# Patient Record
Sex: Female | Born: 1990 | Race: White | Hispanic: No | Marital: Single | Smoking: Current some day smoker
Health system: Southern US, Community
[De-identification: ages and names within clinical notes are randomized; demographics above are authoritative.]

## PROBLEM LIST (undated history)

## (undated) DIAGNOSIS — N390 Urinary tract infection, site not specified: Secondary | ICD-10-CM

## (undated) DIAGNOSIS — K219 Gastro-esophageal reflux disease without esophagitis: Secondary | ICD-10-CM

## (undated) DIAGNOSIS — Z6827 Body mass index (BMI) 27.0-27.9, adult: Secondary | ICD-10-CM

## (undated) DIAGNOSIS — F410 Panic disorder [episodic paroxysmal anxiety] without agoraphobia: Secondary | ICD-10-CM

## (undated) DIAGNOSIS — N920 Excessive and frequent menstruation with regular cycle: Secondary | ICD-10-CM

## (undated) DIAGNOSIS — K259 Gastric ulcer, unspecified as acute or chronic, without hemorrhage or perforation: Secondary | ICD-10-CM

## (undated) DIAGNOSIS — K589 Irritable bowel syndrome without diarrhea: Secondary | ICD-10-CM

## (undated) DIAGNOSIS — F319 Bipolar disorder, unspecified: Secondary | ICD-10-CM

## (undated) DIAGNOSIS — G43909 Migraine, unspecified, not intractable, without status migrainosus: Secondary | ICD-10-CM

## (undated) DIAGNOSIS — F909 Attention-deficit hyperactivity disorder, unspecified type: Secondary | ICD-10-CM

## (undated) DIAGNOSIS — K2970 Gastritis, unspecified, without bleeding: Secondary | ICD-10-CM

## (undated) DIAGNOSIS — F419 Anxiety disorder, unspecified: Secondary | ICD-10-CM

## (undated) DIAGNOSIS — N3289 Other specified disorders of bladder: Secondary | ICD-10-CM

## (undated) HISTORY — DX: Other specified disorders of bladder: N32.89

## (undated) HISTORY — DX: Excessive and frequent menstruation with regular cycle: N92.0

## (undated) HISTORY — DX: Gastric ulcer, unspecified as acute or chronic, without hemorrhage or perforation: K25.9

## (undated) HISTORY — DX: Gastritis, unspecified, without bleeding: K29.70

## (undated) HISTORY — DX: Gastro-esophageal reflux disease without esophagitis: K21.9

## (undated) HISTORY — DX: Urinary tract infection, site not specified: N39.0

## (undated) HISTORY — DX: Migraine, unspecified, not intractable, without status migrainosus: G43.909

## (undated) HISTORY — DX: Irritable bowel syndrome, unspecified: K58.9

## (undated) HISTORY — DX: Body mass index (BMI) 27.0-27.9, adult: Z68.27

## (undated) HISTORY — DX: Panic disorder (episodic paroxysmal anxiety): F41.0

## (undated) HISTORY — PX: WISDOM TOOTH EXTRACTION: SHX21

---
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
## 2014-08-16 DIAGNOSIS — O1490 Unspecified pre-eclampsia, unspecified trimester: Secondary | ICD-10-CM

## 2014-08-16 HISTORY — DX: Unspecified pre-eclampsia, unspecified trimester: O14.90

## 2020-06-03 ENCOUNTER — Emergency Department (HOSPITAL_COMMUNITY)
Admission: EM | Admit: 2020-06-03 | Discharge: 2020-06-03 | Disposition: A | Discharge status: Home / Self Care | Payer: Medicaid Other | Attending: Emergency Medicine | Admitting: Emergency Medicine

## 2020-06-03 ENCOUNTER — Encounter (HOSPITAL_COMMUNITY): Payer: Self-pay | Admitting: *Deleted

## 2020-06-03 ENCOUNTER — Other Ambulatory Visit: Payer: Self-pay

## 2020-06-03 DIAGNOSIS — R002 Palpitations: Secondary | ICD-10-CM | POA: Diagnosis not present

## 2020-06-03 DIAGNOSIS — F1210 Cannabis abuse, uncomplicated: Secondary | ICD-10-CM | POA: Insufficient documentation

## 2020-06-03 DIAGNOSIS — F419 Anxiety disorder, unspecified: Secondary | ICD-10-CM | POA: Diagnosis present

## 2020-06-03 DIAGNOSIS — G4700 Insomnia, unspecified: Secondary | ICD-10-CM | POA: Diagnosis not present

## 2020-06-03 DIAGNOSIS — Z20822 Contact with and (suspected) exposure to covid-19: Secondary | ICD-10-CM | POA: Diagnosis not present

## 2020-06-03 DIAGNOSIS — R0789 Other chest pain: Secondary | ICD-10-CM | POA: Diagnosis not present

## 2020-06-03 DIAGNOSIS — F319 Bipolar disorder, unspecified: Secondary | ICD-10-CM | POA: Diagnosis not present

## 2020-06-03 DIAGNOSIS — F1310 Sedative, hypnotic or anxiolytic abuse, uncomplicated: Secondary | ICD-10-CM | POA: Diagnosis not present

## 2020-06-03 HISTORY — DX: Anxiety disorder, unspecified: F41.9

## 2020-06-03 HISTORY — DX: Attention-deficit hyperactivity disorder, unspecified type: F90.9

## 2020-06-03 HISTORY — DX: Bipolar disorder, unspecified: F31.9

## 2020-06-03 LAB — CBC
HCT: 44.7 % (ref 36.0–46.0)
Hemoglobin: 15.3 g/dL — ABNORMAL HIGH (ref 12.0–15.0)
MCH: 30.2 pg (ref 26.0–34.0)
MCHC: 34.2 g/dL (ref 30.0–36.0)
MCV: 88.3 fL (ref 80.0–100.0)
Platelets: 273 10*3/uL (ref 150–400)
RBC: 5.06 MIL/uL (ref 3.87–5.11)
RDW: 11.8 % (ref 11.5–15.5)
WBC: 11.5 10*3/uL — ABNORMAL HIGH (ref 4.0–10.5)
nRBC: 0 % (ref 0.0–0.2)

## 2020-06-03 LAB — COMPREHENSIVE METABOLIC PANEL
ALT: 20 U/L (ref 0–44)
AST: 17 U/L (ref 15–41)
Albumin: 4.2 g/dL (ref 3.5–5.0)
Alkaline Phosphatase: 77 U/L (ref 38–126)
Anion gap: 9 (ref 5–15)
BUN: 12 mg/dL (ref 6–20)
CO2: 21 mmol/L — ABNORMAL LOW (ref 22–32)
Calcium: 9.1 mg/dL (ref 8.9–10.3)
Chloride: 107 mmol/L (ref 98–111)
Creatinine, Ser: 0.78 mg/dL (ref 0.44–1.00)
GFR calc Af Amer: >60 mL/min (ref >60)
GFR calc non Af Amer: >60 mL/min (ref >60)
Glucose, Bld: 102 mg/dL — ABNORMAL HIGH (ref 70–99)
Potassium: 4 mmol/L (ref 3.5–5.1)
Sodium: 137 mmol/L (ref 135–145)
Total Bilirubin: 0.8 mg/dL (ref 0.3–1.2)
Total Protein: 7.2 g/dL (ref 6.5–8.1)

## 2020-06-03 LAB — SALICYLATE LEVEL: Salicylate Lvl: <7 mg/dL — ABNORMAL LOW (ref 7.0–30.0)

## 2020-06-03 LAB — I-STAT BETA HCG BLOOD, ED (MC, WL, AP ONLY): I-stat hCG, quantitative: <5 m[IU]/mL (ref <5)

## 2020-06-03 LAB — RAPID URINE DRUG SCREEN, HOSP PERFORMED
Amphetamines: NOT DETECTED
Barbiturates: NOT DETECTED
Benzodiazepines: POSITIVE — AB
Cocaine: NOT DETECTED
Opiates: NOT DETECTED
Tetrahydrocannabinol: POSITIVE — AB

## 2020-06-03 LAB — ACETAMINOPHEN LEVEL: Acetaminophen (Tylenol), Serum: <10 ug/mL — ABNORMAL LOW (ref 10–30)

## 2020-06-03 LAB — SARS CORONAVIRUS 2 BY RT PCR (HOSPITAL ORDER, PERFORMED IN ~~LOC~~ HOSPITAL LAB): SARS Coronavirus 2: NEGATIVE

## 2020-06-03 LAB — ETHANOL: Alcohol, Ethyl (B): <10 mg/dL (ref <10)

## 2020-06-03 NOTE — ED Triage Notes (Signed)
Pt reports being 8 months postpartum. Having severe anxiety since Monday. Has been seen by pcp and prescribed xanax with no relief. Anxiety is keeping her awake and nauseated. Denies any SI or HI.

## 2020-06-03 NOTE — ED Provider Notes (Addendum)
[facility] EMERGENCY DEPARTMENT Provider Note   CSN: [id number] Arrival date & time: 06/03/20  1812     History Chief Complaint  Patient presents with  . Anxiety    Laurie Guzman is a 29 y.o. female.  29 year old female with history of bipolar disorder as well as anxiety presents with increased insomnia.  Patient turned about postpartum depression as she is 8 months since she had her baby.  Has been prescribed Xanax which she says helps temporarily.  Denies any SI or HI.  States that times when anxiety and panic occurs she has palpitations and chest discomfort.  No anginal type qualities.        Past Medical History:  Diagnosis Date  . ADHD   . Anxiety   . Bipolar 1 disorder ([facility])     There are no problems to display for this patient.   History reviewed. No pertinent surgical history.   OB History   No obstetric history on file.     History reviewed. No pertinent family history.  Social History   Tobacco Use  . Smoking status: Never Smoker  Substance Use Topics  . Alcohol use: Yes  . Drug use: Never    Home Medications Prior to Admission medications   Not on File    Allergies    Patient has no known allergies.  Review of Systems   Review of Systems  All other systems reviewed and are negative.   Physical Exam Updated Vital Signs BP 137/85 (BP Location: Left Arm)   Pulse 76   Temp 98.1 F (36.7 C) (Oral)   Resp 20   LMP 05/10/2020   SpO2 100%   Physical Exam Vitals and nursing note reviewed.  Constitutional:      General: She is not in acute distress.    Appearance: Normal appearance. She is well-developed. She is not toxic-appearing.  HENT:     Head: Normocephalic and atraumatic.  Eyes:     General: Lids are normal.     Conjunctiva/sclera: Conjunctivae normal.     Pupils: Pupils are equal, round, and reactive to light.  Neck:     Thyroid: No thyroid mass.     Trachea: No tracheal deviation.    Cardiovascular:     Rate and Rhythm: Normal rate and regular rhythm.     Heart sounds: Normal heart sounds. No murmur heard.  No gallop.   Pulmonary:     Effort: Pulmonary effort is normal. No respiratory distress.     Breath sounds: Normal breath sounds. No stridor. No decreased breath sounds, wheezing, rhonchi or rales.  Abdominal:     General: Bowel sounds are normal. There is no distension.     Palpations: Abdomen is soft.     Tenderness: There is no abdominal tenderness. There is no rebound.  Musculoskeletal:        General: No tenderness. Normal range of motion.     Cervical back: Normal range of motion and neck supple.  Skin:    General: Skin is warm and dry.     Findings: No abrasion or rash.  Neurological:     Mental Status: She is alert and oriented to person, place, and time.     GCS: GCS eye subscore is 4. GCS verbal subscore is 5. GCS motor subscore is 6.     Cranial Nerves: No cranial nerve deficit.     Sensory: No sensory deficit.  Psychiatric:        Attention and Perception:  Attention normal.        Mood and Affect: Mood is anxious.        Speech: Speech is rapid and pressured.        Behavior: Behavior normal.        Thought Content: Thought content does not include homicidal or suicidal ideation. Thought content does not include homicidal or suicidal plan.     ED Results / Procedures / Treatments   Labs (all labs ordered are listed, but only abnormal results are displayed) Labs Reviewed  COMPREHENSIVE METABOLIC PANEL - Abnormal; Notable for the following components:      Result Value   CO2 21 (*)    Glucose, Bld 102 (*)    All other components within normal limits  SALICYLATE LEVEL - Abnormal; Notable for the following components:   Salicylate Lvl <7.0 (*)    All other components within normal limits  ACETAMINOPHEN LEVEL - Abnormal; Notable for the following components:   Acetaminophen (Tylenol), Serum <10 (*)    All other components within normal  limits  CBC - Abnormal; Notable for the following components:   WBC 11.5 (*)    Hemoglobin 15.3 (*)    All other components within normal limits  RAPID URINE DRUG SCREEN, HOSP PERFORMED - Abnormal; Notable for the following components:   Benzodiazepines POSITIVE (*)    Tetrahydrocannabinol POSITIVE (*)    All other components within normal limits  ETHANOL  I-STAT BETA HCG BLOOD, ED ([facility], [facility], [facility] ONLY)    EKG None  Radiology No results found.  Procedures Procedures (including critical care time)  Medications Ordered in ED Medications - No data to display  ED Course  I have reviewed the triage vital signs and the nursing notes.  Pertinent labs & imaging results that were available during my care of the patient were reviewed by me and considered in my medical decision making (see chart for details).    MDM Rules/Calculators/A&P                          Patient's labs reviewed and she is medically cleared.  Discussed with behavioral health NP and they have accepted her for her psychiatric evaluation at the facility.  Nurse notified and she will call safe transport.  Patient is agreeable to this.  11:03 PM Patient is now changed her mind who like to go home and says that her doctor schedule her to see a therapist tomorrow Final Clinical Impression(s) / ED Diagnoses Final diagnoses:  None    Rx / DC Orders ED Discharge Orders    None       Lorre Nick, MD 06/03/20 2207    Lorre Nick, MD 06/03/20 2303

## 2020-06-03 NOTE — ED Notes (Signed)
Pt refused transfer d/t refusal for personal belongings removal. Pt states she did not feel comfortable being without her cell phone due to her small child at home. Pt voluntary. Pt's wishes discussed with Dr. Freida Busman.   Pt discharged mother at bedside. Discharge instructions discussed with pt and mother. Pt was able to verbalized understanding with no questions at this time. Pt to follow up with therapist tomorrow. Denies si/hi

## 2020-11-02 ENCOUNTER — Telehealth: Payer: Self-pay | Admitting: Neurology

## 2020-11-02 ENCOUNTER — Ambulatory Visit: Payer: Medicaid Other | Admitting: Neurology

## 2020-11-02 ENCOUNTER — Encounter: Payer: Self-pay | Admitting: Neurology

## 2020-11-02 VITALS — BP 112/72 | HR 84 | Ht 65.5 in | Wt 172.0 lb

## 2020-11-02 DIAGNOSIS — H9190 Unspecified hearing loss, unspecified ear: Secondary | ICD-10-CM

## 2020-11-02 DIAGNOSIS — G08 Intracranial and intraspinal phlebitis and thrombophlebitis: Secondary | ICD-10-CM

## 2020-11-02 DIAGNOSIS — F909 Attention-deficit hyperactivity disorder, unspecified type: Secondary | ICD-10-CM

## 2020-11-02 DIAGNOSIS — F419 Anxiety disorder, unspecified: Secondary | ICD-10-CM | POA: Insufficient documentation

## 2020-11-02 DIAGNOSIS — R51 Headache with orthostatic component, not elsewhere classified: Secondary | ICD-10-CM

## 2020-11-02 DIAGNOSIS — K589 Irritable bowel syndrome without diarrhea: Secondary | ICD-10-CM | POA: Diagnosis not present

## 2020-11-02 DIAGNOSIS — F319 Bipolar disorder, unspecified: Secondary | ICD-10-CM | POA: Insufficient documentation

## 2020-11-02 DIAGNOSIS — G441 Vascular headache, not elsewhere classified: Secondary | ICD-10-CM

## 2020-11-02 DIAGNOSIS — H4710 Unspecified papilledema: Secondary | ICD-10-CM

## 2020-11-02 DIAGNOSIS — O26892 Other specified pregnancy related conditions, second trimester: Secondary | ICD-10-CM | POA: Diagnosis not present

## 2020-11-02 DIAGNOSIS — H5713 Ocular pain, bilateral: Secondary | ICD-10-CM

## 2020-11-02 DIAGNOSIS — H579 Unspecified disorder of eye and adnexa: Secondary | ICD-10-CM

## 2020-11-02 DIAGNOSIS — Z3A13 13 weeks gestation of pregnancy: Secondary | ICD-10-CM

## 2020-11-02 DIAGNOSIS — H538 Other visual disturbances: Secondary | ICD-10-CM

## 2020-11-02 DIAGNOSIS — R519 Headache, unspecified: Secondary | ICD-10-CM | POA: Insufficient documentation

## 2020-11-02 DIAGNOSIS — H5460 Unqualified visual loss, one eye, unspecified: Secondary | ICD-10-CM

## 2020-11-02 NOTE — Telephone Encounter (Signed)
medicaid order sent to [facility]. No auth they will reach out to the patient to schedule.  

## 2020-11-02 NOTE — Addendum Note (Signed)
Addended by: Naomie Dean B on: 11/02/2020 11:39 AM   Modules accepted: Orders

## 2020-11-02 NOTE — Patient Instructions (Signed)
MRI of the brain and eyes. Will also take a look at the blood vessels  To prevent or relieve headaches, try the following: Cool Compress. Lie down and place a cool compress on your head.  Avoid headache triggers. If certain foods or odors seem to have triggered your migraines in the past, avoid them. A headache diary might help you identify triggers.  Include physical activity in your daily routine. Try a daily walk or other moderate aerobic exercise.  Manage stress. Find healthy ways to cope with the stressors, such as delegating tasks on your to-do list.  Practice relaxation techniques. Try deep breathing, yoga, massage and visualization.  Eat regularly. Eating regularly scheduled meals and maintaining a healthy diet might help prevent headaches. Also, drink plenty of fluids.  Follow a regular sleep schedule. Sleep deprivation might contribute to headaches Consider biofeedback. With this mind-body technique, you learn to control certain bodily functions -- such as muscle tension, heart rate and blood pressure -- to prevent headaches or reduce headache pain.    Proceed to emergency room if you experience new or worsening symptoms or symptoms do not resolve, if you have new neurologic symptoms or if headache is severe, or for any concerning symptom.

## 2020-11-02 NOTE — Progress Notes (Addendum)
[facility]    Provider:  Dr Lucia Gaskins Requesting Provider: Buckner Malta, MD Primary Care Provider:  Lise Auer, MD  CC:  Ear pressure, nose pressure, headache, ear popping, dizzy, panic attacks  HPI:  Laurie Guzman is a 29 y.o. female here as requested by Buckner Malta, MD for Migraine.  Past medical history severe anxiety with panic attacks, bipolar disorder.  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  She started getting panic attacks in June/july. With the panic attacks she started having headaches. But now the headaches occur more often, significant ear pressure, ear popping, sharp pains through her ears, pressure right at the sinus under the eyes, she went to ENT and they said everything was normal. Her grandmother says she calls and she is crying, she never had a history of migraines, she feels a lot of pressure in the head, significant dizziness, positional headache, episodes of blurry vision and peripheral vision loss, worse laying down she can;t even lay down it hurts so much and she gets dizzy she feels like she is moving or the room spins. Sounds bother her, light doesn't bother her, she gets nauseated. Most of the time the headaches are around the temples with  throbbing. Grandmother has migraines and her aunt does too. She is [redacted] weeks pregnant. Eye pain as well on movement. She has monocular vision loss episodically in the right eye. No weakness. Blood pressure has been fine. No fever. No other focal neurologic deficits, associated symptoms, inciting events or modifiable factors.  Reviewed notes, labs and imaging from outside physicians, which showed:  See above  Labs: 10/14/2020 positive pregnancy test urine, hgb 13.5 01/02/2019,   Review of Systems: Patient complains of symptoms per HPI as well as the following symptoms: headache, weakness, eye pain, constipation, ringing in ears, spinning sensation, anxiety, decreased energy, racing thoughts. Pertinent negatives and positives per HPI. All others negative.   Social History   Socioeconomic History  . Marital status: Single    Spouse name: Not on file  . Number of children: 2  . Years of education: Not on file  . Highest education level: Some college, no degree  Occupational History  . Not on file  Tobacco Use  . Smoking status: Never Smoker  . Smokeless tobacco: Never Used  Substance and Sexual Activity  . Alcohol use: Not Currently    Comment: quit July 19, 2020  . Drug use: Never  . Sexual activity: Not on file  Other Topics Concern  . Not on file  Social History Narrative   Lives at home with 2 kids and boyfriend    Left handed   Caffeine: "not really"   Social Determinants of Health   Financial Resource Strain:   . Difficulty of Paying Living Expenses: Not on file  Food Insecurity:   .  Worried About Programme researcher, broadcasting/film/video in the Last Year: Not on file  . Ran Out of Food in the Last Year: Not on file  Transportation Needs:   . Lack of Transportation (Medical): Not on file  . Lack of Transportation (Non-Medical): Not on file  Physical Activity:   . Days of Exercise per Week: Not on file  . Minutes of Exercise per Session: Not on file  Stress:   . Feeling of Stress : Not on  file  Social Connections:   . Frequency of Communication with Friends and Family: Not on file  . Frequency of Social Gatherings with Friends and Family: Not on file  . Attends Religious Services: Not on file  . Active Member of Clubs or Organizations: Not on file  . Attends Banker Meetings: Not on file  . Marital Status: Not on file  Intimate Partner Violence:   . Fear of Current or Ex-Partner: Not on file  . Emotionally Abused: Not on file  . Physically Abused: Not on file  . Sexually Abused: Not on file    Family History  Problem Relation Age of Onset  . Migraines Paternal Grandmother   . Migraines Paternal Aunt   . Migraines Paternal Aunt     Past Medical History:  Diagnosis Date  . ADHD   . Anxiety   . Bipolar 1 disorder ([facility])   . Bladder spasms   . Chlamydia infection 06/2008  . IBS (irritable bowel syndrome)   . UTI (urinary tract infection)    used to get bad UTIs    Patient Active Problem List   Diagnosis Date Noted  . Pregnancy headache in second trimester 11/02/2020  . IBS (irritable bowel syndrome)   . Bipolar 1 disorder ([facility])   . Anxiety   . ADHD     Past Surgical History:  Procedure Laterality Date  . WISDOM TOOTH EXTRACTION      Current Outpatient Medications  Medication Sig Dispense Refill  . diphenhydrAMINE (BENADRYL) 50 MG capsule Take 50 mg by mouth in the morning, at noon, and at bedtime.    . ondansetron (ZOFRAN-ODT) 4 MG disintegrating tablet Take 4 mg by mouth as needed for nausea or vomiting.     No current facility-administered medications for this visit.    Allergies as of 11/02/2020  . (No Known Allergies)    Vitals: BP 112/72 (BP Location: Right Arm, Patient Position: Sitting)   Pulse 84   Ht 5' 5.5" (1.664 m)   Wt 172 lb (78 kg)   LMP 05/10/2020   BMI 28.19 kg/m  Last Weight:  Wt Readings from Last 1 Encounters:  11/02/20 172 lb (78 kg)   Last Height:   Ht Readings from Last 1 Encounters:  11/02/20 5'  5.5" (1.664 m)     Physical exam: Exam: Gen: NAD, conversant, well nourised,  well groomed                     CV: RRR, no MRG. No Carotid Bruits. No peripheral edema, warm, nontender Eyes: Conjunctivae clear without exudates or hemorrhage  Neuro: Detailed Neurologic Exam  Speech:    Speech is normal; fluent and spontaneous with normal comprehension.  Cognition:    The patient is oriented to person, place, and time;     recent and remote memory intact;     language fluent;     normal attention, concentration,     fund of knowledge Cranial Nerves:  The pupils are equal, round, and reactive to light. The fundi are slightly elevated Visual fields are full to finger confrontation. Extraocular movements are intact. Trigeminal sensation is intact and the muscles of mastication are normal. The face is symmetric. The palate elevates in the midline. Hearing intact. Voice is normal. Shoulder shrug is normal. The tongue has normal motion without fasciculations.   Coordination:    No dysmetria or ataxia  Gait:    Normal native gait  Motor Observation:    No asymmetry, no atrophy, and no involuntary movements noted. Tone:    Normal muscle tone.    Posture:    Posture is normal. normal erect    Strength:    Strength is V/V in the upper and lower limbs.      Sensation: intact to LT     Reflex Exam:  DTR's:    Deep tendon reflexes in the upper and lower extremities are normal bilaterally.   Toes:    The toes are downgoing bilaterally.   Clonus:    Clonus is absent.    Assessment/Plan: This is a patient with an unusual constellation of symptoms, she is also [redacted] weeks pregnant, she reports a positional headache, much worse when she bends over or lays down, lots of pressure in her head, hearing changes and popping in her ears, visual changes bilaterally and episodic vision loss of the right eye, eye pain, funduscopy may show elevated optic disks, need MRI of the brain and orbits.   Also considering the symptoms in her pregnancy needed MRV to rule out cerebral venous thrombosis.  MRI of the brain and orbits: MRI brain due to concerning symptoms of morning headaches, positional headaches,vision changes, papilledema, hearing changes, eye pressure and pain, hypercoag state(pregnancy)  to look for space occupying mass, chiari or intracranial hypertension (pseudotumor) or other. Also need MRV for cerebral venous thrombosis.   Orders Placed This Encounter  Procedures  . MR ORBITS WO CONTRAST  . MR MRV HEAD WO CM  . MR BRAIN WO CONTRAST  . CBC  . Comprehensive metabolic panel     Cc: Buckner Malta, MD,  Lise Auer, MD  Naomie Dean, MD  [facility] [address] [place], [place] [address]  Phone [phone] Fax [phone]

## 2020-11-03 ENCOUNTER — Telehealth: Payer: Self-pay | Admitting: *Deleted

## 2020-11-03 LAB — CBC
Hematocrit: 39.1 % (ref 34.0–46.6)
Hemoglobin: 13.9 g/dL (ref 11.1–15.9)
MCH: 31.6 pg (ref 26.6–33.0)
MCHC: 35.5 g/dL (ref 31.5–35.7)
MCV: 89 fL (ref 79–97)
Platelets: 241 10*3/uL (ref 150–450)
RBC: 4.4 x10E6/uL (ref 3.77–5.28)
RDW: 12.1 % (ref 11.7–15.4)
WBC: 9.3 10*3/uL (ref 3.4–10.8)

## 2020-11-03 LAB — COMPREHENSIVE METABOLIC PANEL
ALT: 10 IU/L (ref 0–32)
AST: 10 IU/L (ref 0–40)
Albumin/Globulin Ratio: 1.6 (ref 1.2–2.2)
Albumin: 4.2 g/dL (ref 3.9–5.0)
Alkaline Phosphatase: 72 IU/L (ref 44–121)
BUN/Creatinine Ratio: 17 (ref 9–23)
BUN: 10 mg/dL (ref 6–20)
Bilirubin Total: 0.3 mg/dL (ref 0.0–1.2)
CO2: 20 mmol/L (ref 20–29)
Calcium: 9.5 mg/dL (ref 8.7–10.2)
Chloride: 103 mmol/L (ref 96–106)
Creatinine, Ser: 0.59 mg/dL (ref 0.57–1.00)
GFR calc Af Amer: 144 mL/min/{1.73_m2} (ref >59)
GFR calc non Af Amer: 125 mL/min/{1.73_m2} (ref >59)
Globulin, Total: 2.6 g/dL (ref 1.5–4.5)
Glucose: 76 mg/dL (ref 65–99)
Potassium: 4.1 mmol/L (ref 3.5–5.2)
Sodium: 138 mmol/L (ref 134–144)
Total Protein: 6.8 g/dL (ref 6.0–8.5)

## 2020-11-03 NOTE — Telephone Encounter (Signed)
-----   Message from Anson Fret, MD sent at 11/03/2020  8:57 AM EST ----- Blood work normal thanks

## 2020-11-03 NOTE — Telephone Encounter (Signed)
Called pt and let her know blood work was normal. Pt verbalized understanding and appreciation for the call.

## 2020-11-12 ENCOUNTER — Other Ambulatory Visit: Payer: Self-pay

## 2020-11-12 ENCOUNTER — Emergency Department (HOSPITAL_COMMUNITY): Payer: Medicaid Other

## 2020-11-12 ENCOUNTER — Emergency Department (HOSPITAL_COMMUNITY)
Admission: EM | Admit: 2020-11-12 | Discharge: 2020-11-13 | Disposition: A | Discharge status: Home / Self Care | Payer: Medicaid Other | Attending: Emergency Medicine | Admitting: Emergency Medicine

## 2020-11-12 DIAGNOSIS — Z3A14 14 weeks gestation of pregnancy: Secondary | ICD-10-CM | POA: Insufficient documentation

## 2020-11-12 DIAGNOSIS — O99891 Other specified diseases and conditions complicating pregnancy: Secondary | ICD-10-CM | POA: Insufficient documentation

## 2020-11-12 DIAGNOSIS — H9391 Unspecified disorder of right ear: Secondary | ICD-10-CM | POA: Diagnosis not present

## 2020-11-12 DIAGNOSIS — Z5321 Procedure and treatment not carried out due to patient leaving prior to being seen by health care provider: Secondary | ICD-10-CM | POA: Insufficient documentation

## 2020-11-12 LAB — URINALYSIS, ROUTINE W REFLEX MICROSCOPIC
Bilirubin Urine: NEGATIVE
Glucose, UA: NEGATIVE mg/dL
Hgb urine dipstick: NEGATIVE
Ketones, ur: NEGATIVE mg/dL
Leukocytes,Ua: NEGATIVE
Nitrite: NEGATIVE
Protein, ur: NEGATIVE mg/dL
Specific Gravity, Urine: 1.017 (ref 1.005–1.030)
pH: 5 (ref 5.0–8.0)

## 2020-11-12 LAB — COMPREHENSIVE METABOLIC PANEL
ALT: 15 U/L (ref 0–44)
AST: 16 U/L (ref 15–41)
Albumin: 3.4 g/dL — ABNORMAL LOW (ref 3.5–5.0)
Alkaline Phosphatase: 63 U/L (ref 38–126)
Anion gap: 11 (ref 5–15)
BUN: 10 mg/dL (ref 6–20)
CO2: 20 mmol/L — ABNORMAL LOW (ref 22–32)
Calcium: 9.1 mg/dL (ref 8.9–10.3)
Chloride: 104 mmol/L (ref 98–111)
Creatinine, Ser: 0.59 mg/dL (ref 0.44–1.00)
GFR, Estimated: >60 mL/min (ref >60)
Glucose, Bld: 149 mg/dL — ABNORMAL HIGH (ref 70–99)
Potassium: 3.4 mmol/L — ABNORMAL LOW (ref 3.5–5.1)
Sodium: 135 mmol/L (ref 135–145)
Total Bilirubin: 0.6 mg/dL (ref 0.3–1.2)
Total Protein: 6.5 g/dL (ref 6.5–8.1)

## 2020-11-12 LAB — CBC WITH DIFFERENTIAL/PLATELET
Abs Immature Granulocytes: 0.06 10*3/uL (ref 0.00–0.07)
Basophils Absolute: 0 10*3/uL (ref 0.0–0.1)
Basophils Relative: 0 %
Eosinophils Absolute: 0.1 10*3/uL (ref 0.0–0.5)
Eosinophils Relative: 1 %
HCT: 39.6 % (ref 36.0–46.0)
Hemoglobin: 13.4 g/dL (ref 12.0–15.0)
Immature Granulocytes: 1 %
Lymphocytes Relative: 16 %
Lymphs Abs: 1.8 10*3/uL (ref 0.7–4.0)
MCH: 30.2 pg (ref 26.0–34.0)
MCHC: 33.8 g/dL (ref 30.0–36.0)
MCV: 89.2 fL (ref 80.0–100.0)
Monocytes Absolute: 0.4 10*3/uL (ref 0.1–1.0)
Monocytes Relative: 4 %
Neutro Abs: 9.1 10*3/uL — ABNORMAL HIGH (ref 1.7–7.7)
Neutrophils Relative %: 78 %
Platelets: 220 10*3/uL (ref 150–400)
RBC: 4.44 MIL/uL (ref 3.87–5.11)
RDW: 12.3 % (ref 11.5–15.5)
WBC: 11.6 10*3/uL — ABNORMAL HIGH (ref 4.0–10.5)
nRBC: 0 % (ref 0.0–0.2)

## 2020-11-12 MED ORDER — ACETAMINOPHEN 500 MG PO TABS
1000.0000 mg | ORAL_TABLET | Freq: Once | ORAL | Status: AC
  Administered 2020-11-12: 1000 mg via ORAL
  Filled 2020-11-12: qty 2

## 2020-11-12 NOTE — ED Notes (Signed)
Pt name called for vitals, no response

## 2020-11-12 NOTE — ED Triage Notes (Signed)
Pt here for eval of R ear pressure. Being seen for same by [facility] and due to have MRIs tomorrow for same. Pt reports that pressure is so bad that it caused her to briefly pass out this morning. Also sts it feels like her throat is swelling, however pt speaking in complete sentences without difficulty. No shob. Pt [redacted] weeks pregnant.

## 2020-11-13 ENCOUNTER — Other Ambulatory Visit: Payer: Medicaid Other

## 2020-11-13 NOTE — ED Notes (Signed)
Pt stated she's leaving AMA since she has an appt [facility]. Advised to return if symptoms worsen.

## 2020-11-15 ENCOUNTER — Ambulatory Visit (INDEPENDENT_AMBULATORY_CARE_PROVIDER_SITE_OTHER): Payer: Medicaid Other | Admitting: Allergy and Immunology

## 2020-11-15 ENCOUNTER — Encounter: Payer: Self-pay | Admitting: Allergy and Immunology

## 2020-11-15 ENCOUNTER — Telehealth: Payer: Self-pay | Admitting: Neurology

## 2020-11-15 ENCOUNTER — Other Ambulatory Visit: Payer: Self-pay

## 2020-11-15 VITALS — BP 110/48 | HR 84 | Resp 16 | Ht 66.0 in | Wt 172.6 lb

## 2020-11-15 DIAGNOSIS — J3089 Other allergic rhinitis: Secondary | ICD-10-CM

## 2020-11-15 DIAGNOSIS — F410 Panic disorder [episodic paroxysmal anxiety] without agoraphobia: Secondary | ICD-10-CM

## 2020-11-15 DIAGNOSIS — G43909 Migraine, unspecified, not intractable, without status migrainosus: Secondary | ICD-10-CM

## 2020-11-15 DIAGNOSIS — F411 Generalized anxiety disorder: Secondary | ICD-10-CM | POA: Diagnosis not present

## 2020-11-15 DIAGNOSIS — H6993 Unspecified Eustachian tube disorder, bilateral: Secondary | ICD-10-CM

## 2020-11-15 DIAGNOSIS — H6983 Other specified disorders of Eustachian tube, bilateral: Secondary | ICD-10-CM | POA: Diagnosis not present

## 2020-11-15 DIAGNOSIS — Z3A14 14 weeks gestation of pregnancy: Secondary | ICD-10-CM

## 2020-11-15 MED ORDER — LORATADINE 10 MG PO TABS
ORAL_TABLET | ORAL | 11 refills | Status: DC
Start: 2020-11-15 — End: 2021-08-11

## 2020-11-15 NOTE — Telephone Encounter (Signed)
It doesn't look like she is currently inpatient. I have already ordered the MRV she can proceed to that as we already planned but cancel MR orbits please. thanks

## 2020-11-15 NOTE — Patient Instructions (Addendum)
  1. Blood -area two aero allergen profile, TSH, FT4, TP, [place] pig IgE  2.  Treat and prevent inflammation:   A. OTC Rhinocort / budesonide - 1 spray each nostril 2 times per day   3. Avoid all forms of caffeine and chocolate   4.  Discuss with obstetrician about receiving Covid vaccine and flu vaccine.  5. Can try Loratadine 10 mg - 1 tablet 1 time per day  6.  Can continue Benadryl if needed  7.  Further evaluation and treatment?

## 2020-11-15 NOTE — Telephone Encounter (Signed)
Patient did have a MRI Brain in the ER on 11/12/20. Do you want her to proceed on anything other?

## 2020-11-15 NOTE — Progress Notes (Signed)
[name] - [place] - [place]   Dear Dr. Welton Flakes,  Thank you for referring Laurie Guzman to the [facility] [place] on 11/15/2020.   Below is a summation of this patient's evaluation and recommendations.  Thank you for your referral. I will keep you informed about this patient's response to treatment.   If you have any questions please do not hesitate to contact me.   Sincerely,  Jessica Priest, MD Allergy / Immunology [facility]   ______________________________________________________________________    NEW PATIENT NOTE  Referring Provider: Lise Auer, MD Primary Provider: Lise Auer, MD Date of office visit: 11/15/2020    Subjective:   Chief Complaint:  Laurie Guzman (DOB: 1991-11-11) is a 29 y.o. female who presents to the clinic on 11/15/2020 with a chief complaint of sinus pressure .     HPI: [name] presents to this clinic in evaluation of symptoms that developed initially on 16 August 2020.  On 16 August 2020 she developed ear pain and headache and dizziness and this has been a persistent issue since that point in time.  She has recurrent bouts of vertigo and a sensation that her right eye is pushing out and a spot in her right visual field and a headache that can sometimes involve her entire head feeling as though there is intense pressure.  It became so significant this week and that she ended up in the emergency room with this head pressure issue and dizziness for which an MRI was obtained which apparently was normal.  She has seen a neurologist regarding this issue in the past and no specific therapy was administered during that evaluation.  She has seen ENT regarding this issue and no specific therapy was administered during that evaluation.  Apparently her hearing checked out okay and there was no evidence of Mnire's disease.  She has very  little airway issue.  She has no nose blowing or nasal congestion or anosmia or decreased ability to taste.  She does have some postnasal drip.  She has no other significant airway issues.  She does not really have an allergic history in general.  She has tried some antihistamines to address this issue which have not helped.  She has been using Benadryl which does help this issue.  There is no obvious provoking factor giving rise to this issue however she has found "black mold" in her mobile home.  She thinks that she is a little bit worse when she is staying at her house and she is now staying at her grandmother's house.  She started Zoloft about 3 weeks prior to her scenario but developed some headaches and had to discontinue the Zoloft and was switched to Paxil but still continued to have headaches and she discontinued this agent about 1 week prior to the onset of her scenario.  She is pregnant.  There was an interval of time since her last menstrual period when she did not have any sexual intercourse until 20 August 2020 which is a date that is subsequent to the onset of all of her symptoms on 16 August 2020.  She has been having some issues with anxiety lately.  These appear to be accompanied by panic attacks.  She does consume caffeine occasionally and has chocolate on occasion as well.  Past Medical History:  Diagnosis Date  . ADHD   . Anxiety   . Bipolar 1  disorder (HCC)   . Bladder spasms   . Chlamydia infection 06/2008  . IBS (irritable bowel syndrome)   . UTI (urinary tract infection)    used to get bad UTIs    Past Surgical History:  Procedure Laterality Date  . WISDOM TOOTH EXTRACTION      Allergies as of 11/15/2020   No Known Allergies     Medication List    diphenhydrAMINE 50 MG capsule Commonly known as: BENADRYL Take 50 mg by mouth in the morning, at noon, and at bedtime.       Review of systems negative except as noted in HPI / PMHx or noted  below:  Review of Systems  Constitutional: Negative.   HENT: Negative.   Eyes: Negative.   Respiratory: Negative.   Cardiovascular: Negative.   Gastrointestinal: Negative.   Genitourinary: Negative.   Musculoskeletal: Negative.   Skin: Negative.   Neurological: Negative.   Endo/Heme/Allergies: Negative.   Psychiatric/Behavioral: Negative.     Family History  Problem Relation Age of Onset  . Irritable bowel syndrome Mother   . Irritable bowel syndrome Father   . Ulcers Father        Stomach Ulcers  . Migraines Paternal Grandmother   . Migraines Paternal Aunt   . Migraines Paternal Aunt     Social History   Socioeconomic History  . Marital status: Single    Spouse name: Not on file  . Number of children: 2  . Years of education: Not on file  . Highest education level: Some college, no degree  Occupational History  . Not on file  Tobacco Use  . Smoking status: Former Smoker    Types: Cigarettes  . Smokeless tobacco: Never Used  . Tobacco comment: Social smoker; last smoked in August 2021  Substance and Sexual Activity  . Alcohol use: Not Currently    Comment: quit July 19, 2020  . Drug use: Never  . Sexual activity: Not on file  Other Topics Concern  . Not on file  Social History Narrative   Lives at home with 2 kids and boyfriend    Left handed   Caffeine: "not really"    Environmental and Social history  Lives in a mobile home with a dry environment, carpet located inside the household, a cat and [place] pig located inside the household,, no plastic on the bed, no plastic on the pillow, no smoking ongoing with inside the household.  She works as a Lawyer.  Objective:   Vitals:   11/15/20 0944  BP: (!) 110/48  Pulse: 84  Resp: 16  SpO2: 98%   Height: 5\' 6"  (167.6 cm) Weight: 172 lb 9.6 oz (78.3 kg)  Physical Exam Constitutional:      Appearance: She is not diaphoretic.  HENT:     Head: Normocephalic.     Right Ear: Tympanic membrane, ear canal  and external ear normal.     Left Ear: Tympanic membrane, ear canal and external ear normal.     Nose: Nose normal. No mucosal edema or rhinorrhea.     Mouth/Throat:     Pharynx: Uvula midline. No oropharyngeal exudate.  Eyes:     Conjunctiva/sclera: Conjunctivae normal.  Neck:     Thyroid: No thyromegaly.     Trachea: Trachea normal. No tracheal tenderness or tracheal deviation.  Cardiovascular:     Rate and Rhythm: Normal rate and regular rhythm.     Heart sounds: Normal heart sounds, S1 normal and S2 normal. No murmur heard.  Pulmonary:     Effort: No respiratory distress.     Breath sounds: Normal breath sounds. No stridor. No wheezing or rales.  Lymphadenopathy:     Head:     Right side of head: No tonsillar adenopathy.     Left side of head: No tonsillar adenopathy.     Cervical: No cervical adenopathy.  Skin:    Findings: No erythema or rash.     Nails: There is no clubbing.  Neurological:     Mental Status: She is alert.     Diagnostics: Allergy skin tests were not performed.     Results of an brain MRI obtained 12 November 2020 identified the following:  Brain: Cerebral volume is normal. No focal parenchymal signal abnormality is identified. There is no acute infarct. No evidence of intracranial mass. No chronic intracranial blood products. No extra-axial fluid collection. No midline shift. Vascular: Expected proximal arterial flow voids. Skull and upper cervical spine: No focal marrow lesion.  Sinuses/Orbits: Visualized orbits show no acute finding. No significant paranasal sinus disease.  Results of blood tests obtained 12 November 2020 identified creatinine 0.59 mg/DL, AST 16 U/L, ALT 15 U/L, glucose 149 mg/DL, WBC 84.6, absolute eosinophil 100, absolute lymphocyte 1800, hemoglobin 13.4, platelet 220,  Assessment and Plan:    1. Dysfunction of both eustachian tubes   2. Migraine syndrome   3. Perennial allergic rhinitis   4. Generalized  anxiety disorder with panic attacks   5. [redacted] weeks gestation of pregnancy     1. Blood -area two aero allergen profile, TSH, FT4, TP, [place] pig IgE  2.  Treat and prevent inflammation:   A. OTC Rhinocort / budesonide - 1 spray each nostril 2 times per day   3. Avoid all forms of caffeine and chocolate   4.  Discuss with obstetrician about receiving Covid vaccine and flu vaccine.  5. Can try Loratadine 10 mg - 1 tablet 1 time per day  6.  Can continue Benadryl if needed  7.  Further evaluation and treatment?  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  As well, will check for a thyroid dysfunction that can contribute to some of her symptoms especially her generalized anxiety.  She also needs to discontinue all caffeine and chocolate consumption given her generalized anxiety issue.  Certainly she appears to have a migraine syndrome and she needs to follow-up with neurology regarding further management of this issue.  I would like for her to consistently use a anti-inflammatory medication for her upper airway in an attempt to minimize any sinus drainage port or eustachian tube swelling that may be present.  Further evaluation and treatment will be based upon her response to the approach noted above and the results of her diagnostic testing.  Jessica Priest, MD Allergy / Immunology [facility] [place]

## 2020-11-15 NOTE — Telephone Encounter (Signed)
Pt is asking if Dr Lucia Gaskins will send an order for her to have her MRI at the hospital, please call pt to discuss.

## 2020-11-16 ENCOUNTER — Encounter: Payer: Self-pay | Admitting: Allergy and Immunology

## 2020-11-16 NOTE — Telephone Encounter (Signed)
I spoke to the patient and asked her if she wanted to have her MRV at the hospital she stated that she is claustrophobic and I offered her that it would be better for her to go to [facility] if she is claustrophobic she states she is and states she started crying when she was the machine before. I told her the open MRI would be better for her. I fax the order to [facility] they will reach out to the patient to schedule.

## 2020-11-17 ENCOUNTER — Other Ambulatory Visit: Payer: Self-pay | Admitting: *Deleted

## 2020-11-17 ENCOUNTER — Telehealth: Payer: Self-pay | Admitting: Neurology

## 2020-11-17 MED ORDER — METOCLOPRAMIDE HCL 10 MG PO TABS
10.0000 mg | ORAL_TABLET | Freq: Three times a day (TID) | ORAL | 0 refills | Status: DC | PRN
Start: 2020-11-17 — End: 2021-01-24

## 2020-11-17 NOTE — Telephone Encounter (Signed)
Spoke with patient. She has had a headache off and on that extends from the top of her back to the neck and back of head, "tension". Yesterday she developed a headache that extends from temple to temple and she states with her fingers she can feel the veins popping out on the sides and pressure. Pt denies any other neurologic symptoms. She endorses nausea and feeling lightheaded. Pt stated she has been lying around and feeling lightheaded. She checked her BP and it was in the 70s while upright. She laid back down and ate some breakfast. Pt rechecked BP while on the phone with me and it was 113/67 HR 71 sitting up. Pt states BP is usually 110s/70s. She takes Zofran ODT 4 mg PRN nausea. Pt is almost [redacted] weeks pregnant. Pt went to the ER on 12/3 and had an MRI brain completed. She was not made aware of the results and has asked. I advised the pt I would consult Dr Lucia Gaskins and give her a call back. The pt was advised that if she worsens or her BP drops again she should go to the ER. Pt verbalized understanding and appreciation.

## 2020-11-17 NOTE — Telephone Encounter (Addendum)
Per Dr Lucia Gaskins, she could prescribe Reglan to try. If this does not work will reevaluate and consider nerve blocks.

## 2020-11-17 NOTE — Telephone Encounter (Signed)
Per Dr Lucia Gaskins, prescribe Reglan 10 mg TID PRN. Pt may take this TID for the first 3 days. I called the pt and advised her of this. Sent rx to pharmacy per v.o. Dr Lucia Gaskins.

## 2020-11-17 NOTE — Telephone Encounter (Signed)
MRI of the brain was normal. I would have her call her obgyn and also go back to the ED if worsens.

## 2020-11-17 NOTE — Telephone Encounter (Signed)
I called the patient back and we discussed per Dr Lucia Gaskins, her MRI brain was normal, proceed to ER if she worsens, and call OBGYN. We discussed the Reglan and patient was happy to try this. We discussed some potential side effects like GI, drowsiness, and I advised her to call immediately if she were to develop any tremor or uncontrolled muscle movements. We discussed this will likely be prescribed up to three times daily and Rx is currently pending submission to pharmacy as Dr Lucia Gaskins is currently with a patient. Pt verbalized appreciation. She will contact her OBGYN.

## 2020-11-17 NOTE — Telephone Encounter (Signed)
scheduled at [facility] 12/20 430pm

## 2020-11-17 NOTE — Telephone Encounter (Signed)
Pt called stating that she has been suffering from a bad headache and has been light headed and due to her being pregnant she has taken Tylenol and it is not going away. Pt has taken her BP 30 min ago and it was 78/63 Pt would like to speak to RN. Please advise.

## 2020-11-19 LAB — ALLERGENS W/TOTAL IGE AREA 2

## 2020-11-19 LAB — TSH+FREE T4
Free T4: 1.05 ng/dL (ref 0.82–1.77)
TSH: 0.652 u[IU]/mL (ref 0.450–4.500)

## 2020-11-19 LAB — ALLERGEN, GUINEA PIG EPITHELIUM, E6: Guinea Pig Epithelium: <0.1 kU/L

## 2020-11-19 LAB — THYROID PEROXIDASE ANTIBODY: Thyroperoxidase Ab SerPl-aCnc: 14 IU/mL (ref 0–34)

## 2020-11-25 ENCOUNTER — Telehealth: Payer: Self-pay | Admitting: Allergy and Immunology

## 2020-11-25 NOTE — Telephone Encounter (Signed)
Asking for lab results please.

## 2020-12-02 ENCOUNTER — Telehealth: Payer: Self-pay | Admitting: *Deleted

## 2020-12-02 NOTE — Telephone Encounter (Signed)
Received results from [facility] for MRV head. Results normal. I called the patient and let her know. She verbalized understanding. She told me she believes she is allergic to mold. She found mold in the trailer she had been living in. She moved in with her grandmother. Headache, congestion feeling and other symptoms had improved and she moved into a duplex. Unfortunately she found mold in the carpet and also her symptoms have returned. She is going to try to move in with someone else until they can find a new place. I added mold to pt's allergy list.

## 2020-12-14 ENCOUNTER — Ambulatory Visit: Payer: Medicaid Other | Admitting: Neurology

## 2020-12-15 ENCOUNTER — Encounter: Payer: Self-pay | Admitting: Allergy and Immunology

## 2020-12-21 ENCOUNTER — Telehealth: Payer: Self-pay | Admitting: Neurology

## 2020-12-21 NOTE — Telephone Encounter (Signed)
Would one of you mind calling patient and scheduling her for an appointment? If she is having more symptoms I need to see her. She was instructed to follow up in 4 weeks after last appointment. If Amy or Aundra Millet have anything next week she can be scheduled with them. Otherwise I can put her on at the end of the day any day let me know, thanks

## 2020-12-21 NOTE — Telephone Encounter (Signed)
Pt waking up with migraines and her ears feeling full, pt asking for a call to discuss.

## 2020-12-30 ENCOUNTER — Other Ambulatory Visit: Payer: Self-pay

## 2020-12-30 ENCOUNTER — Ambulatory Visit: Payer: Medicaid Other | Admitting: Neurology

## 2020-12-30 ENCOUNTER — Encounter: Payer: Self-pay | Admitting: Neurology

## 2020-12-30 VITALS — BP 108/65 | HR 76 | Ht 65.5 in | Wt 184.0 lb

## 2020-12-30 DIAGNOSIS — R519 Headache, unspecified: Secondary | ICD-10-CM | POA: Diagnosis not present

## 2020-12-30 MED ORDER — NORTRIPTYLINE HCL 25 MG PO CAPS
25.0000 mg | ORAL_CAPSULE | Freq: Every day | ORAL | 3 refills | Status: DC
Start: 2020-12-30 — End: 2021-01-24

## 2020-12-30 NOTE — Patient Instructions (Signed)
Start Nortriptyline at bedtime  Nortriptyline capsules What is this medicine? NORTRIPTYLINE (nor TRIP ti leen) is used to treat depression and headaches.  This medicine may be used for other purposes; ask your health care provider or pharmacist if you have questions. COMMON BRAND NAME(S): Aventyl, Pamelor What should I tell my health care provider before I take this medicine? They need to know if you have any of these conditions:  bipolar disorder  Brugada syndrome  difficulty passing urine  glaucoma  heart disease  if you drink alcohol  liver disease  schizophrenia  seizures  suicidal thoughts, plans or attempt; a previous suicide attempt by you or a family member  thyroid disease  an unusual or allergic reaction to nortriptyline, other tricyclic antidepressants, other medicines, foods, dyes, or preservatives  pregnant or trying to get pregnant  breast-feeding How should I use this medicine? Take this medicine by mouth with a glass of water. Follow the directions on the prescription label. Take your doses at regular intervals. Do not take it more often than directed. Do not stop taking this medicine suddenly except upon the advice of your doctor. Stopping this medicine too quickly may cause serious side effects or your condition may worsen. A special MedGuide will be given to you by the pharmacist with each prescription and refill. Be sure to read this information carefully each time. Talk to your pediatrician regarding the use of this medicine in children. Special care may be needed. Overdosage: If you think you have taken too much of this medicine contact a poison control center or emergency room at once. NOTE: This medicine is only for you. Do not share this medicine with others. What if I miss a dose? If you miss a dose, take it as soon as you can. If it is almost time for your next dose, take only that dose. Do not take double or extra doses. What may interact with  this medicine? Do not take this medicine with any of the following medications:  cisapride  dronedarone  linezolid  MAOIs like Carbex, Eldepryl, Marplan, Nardil, and Parnate  methylene blue (injected into a vein)  pimozide  thioridazine This medicine may also interact with the following medications:  alcohol  antihistamines for allergy, cough, and cold  atropine  certain medicines for bladder problems like oxybutynin, tolterodine  certain medicines for depression like amitriptyline, fluoxetine, sertraline  certain medicines for Parkinson's disease like benztropine, trihexyphenidyl  certain medicines for stomach problems like dicyclomine, hyoscyamine  certain medicines for travel sickness like scopolamine  chlorpropamide  cimetidine  ipratropium  other medicines that prolong the QT interval (an abnormal heart rhythm) like dofetilide  other medicines that can cause serotonin syndrome like St. John's Wort, fentanyl, lithium, tramadol, tryptophan, buspirone, and some medicines for headaches like sumatriptan or rizatriptan  quinidine  reserpine  thyroid medicine This list may not describe all possible interactions. Give your health care provider a list of all the medicines, herbs, non-prescription drugs, or dietary supplements you use. Also tell them if you smoke, drink alcohol, or use illegal drugs. Some items may interact with your medicine. What should I watch for while using this medicine? Tell your doctor if your symptoms do not get better or if they get worse. Visit your doctor or health care professional for regular checks on your progress. Because it may take several weeks to see the full effects of this medicine, it is important to continue your treatment as prescribed by your doctor. Patients and their families  should watch out for new or worsening thoughts of suicide or depression. Also watch out for sudden changes in feelings such as feeling anxious,  agitated, panicky, irritable, hostile, aggressive, impulsive, severely restless, overly excited and hyperactive, or not being able to sleep. If this happens, especially at the beginning of treatment or after a change in dose, call your health care professional. Bonita Quin may get drowsy or dizzy. Do not drive, use machinery, or do anything that needs mental alertness until you know how this medicine affects you. Do not stand or sit up quickly, especially if you are an older patient. This reduces the risk of dizzy or fainting spells. Alcohol may interfere with the effect of this medicine. Avoid alcoholic drinks. Do not treat yourself for coughs, colds, or allergies without asking your doctor or health care professional for advice. Some ingredients can increase possible side effects. Your mouth may get dry. Chewing sugarless gum or sucking hard candy, and drinking plenty of water may help. Contact your doctor if the problem does not go away or is severe. This medicine may cause dry eyes and blurred vision. If you wear contact lenses you may feel some discomfort. Lubricating drops may help. See your eye doctor if the problem does not go away or is severe. This medicine can cause constipation. Try to have a bowel movement at least every 2 to 3 days. If you do not have a bowel movement for 3 days, call your doctor or health care professional. This medicine can make you more sensitive to the sun. Keep out of the sun. If you cannot avoid being in the sun, wear protective clothing and use sunscreen. Do not use sun lamps or tanning beds/booths. What side effects may I notice from receiving this medicine? Side effects that you should report to your doctor or health care professional as soon as possible:  allergic reactions like skin rash, itching or hives, swelling of the face, lips, or tongue  anxious  breathing problems  changes in vision  confusion  elevated mood, decreased need for sleep, racing thoughts,  impulsive behavior  eye pain  fast, irregular heartbeat  feeling faint or lightheaded, falls  feeling agitated, angry, or irritable  fever with increased sweating  hallucination, loss of contact with reality  seizures  stiff muscles  suicidal thoughts or other mood changes  tingling, pain, or numbness in the feet or hands  trouble passing urine or change in the amount of urine  trouble sleeping  unusually weak or tired  vomiting  yellowing of the eyes or skin Side effects that usually do not require medical attention (report to your doctor or health care professional if they continue or are bothersome):  change in sex drive or performance  change in appetite or weight  constipation  dizziness  dry mouth  nausea  tired  tremors  upset stomach This list may not describe all possible side effects. Call your doctor for medical advice about side effects. You may report side effects to FDA at 1-800-FDA-1088. Where should I keep my medicine? Keep out of the reach of children. Store at room temperature between 15 and 30 degrees C (59 and 86 degrees F). Keep container tightly closed. Throw away any unused medicine after the expiration date. NOTE: This sheet is a summary. It may not cover all possible information. If you have questions about this medicine, talk to your doctor, pharmacist, or health care provider.  2021 Elsevier/Gold Standard (2020-07-13 15:25:34)

## 2020-12-30 NOTE — Progress Notes (Signed)
[facility]    Provider:  Dr Lucia Gaskins Requesting Provider: Lise Auer, MD Primary Care Provider:  Lise Auer, MD  CC:  Ear pressure, nose pressure, headache, ear popping, dizzy, panic attacks  Interval history December 30, 2020: Patient returns, MRI of the brain was unremarkable, we reviewed images together and discussed. We discussed that she has a family history of migraines, this may be migraines or tension type headache is hard to know.  At this time we will start her on nortriptyline:We discussed options. Nortriptyline and reglan are considered low risk in pregnancy but no medication is entirely safe. There have been rare associations with Nortriptyline and limb anomalies and cardiac anomalies and other birth defects but this is less likely given you arein your 2nd trimester,  zofran may  help with nausea if needed. Tylenol is considered safe but there have been reports of birth defects even with tylenol, use sparingly.Marland Kitchen    MRI 11/12/2020: IMPRESSION:Unremarkable noncontrast MRI appearance of the brain. No evidence of acute intracranial abnormality.   HPI 11/02/2020:  Laurie Guzman is a 30 y.o. female here as requested by Lise Auer, MD for Migraine.  Past medical history severe anxiety with panic attacks, bipolar disorder.  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  She started getting panic attacks in June/july. With the panic attacks she started having headaches. But now the headaches occur more often, significant ear pressure, ear popping, sharp pains through her ears, pressure right at the sinus under the eyes, she went to ENT and they said everything was normal. Her grandmother says she calls and she is crying, she never had a history of migraines, she feels a lot of pressure in the head, significant dizziness, positional headache, episodes of blurry vision and peripheral vision loss, worse laying down she can;t even lay down it hurts so much and she gets dizzy she feels like she is moving or the room spins. Sounds bother her, light doesn't bother her, she gets nauseated. Most of the time the headaches are around the temples with throbbing. Grandmother has migraines and her aunt does too. She is [redacted] weeks pregnant. Eye pain as well on movement. She has monocular vision loss episodically in the right eye. No weakness. Blood pressure has been fine. No fever. No other focal neurologic deficits, associated symptoms, inciting events or modifiable factors.  Reviewed notes, labs and imaging from outside physicians, which showed:  See above  Labs: 10/14/2020 positive pregnancy test urine, hgb 13.5 01/02/2019,   Review of Systems: Patient complains of symptoms per HPI as well as the following symptoms: headache . Pertinent negatives and positives per HPI. All others negative    Social History   Socioeconomic History  . Marital status: Single    Spouse name: Not on file  .  Number of children: 2  . Years of education: Not on file  . Highest education level: Some college, no degree  Occupational History  . Not on file  Tobacco Use  . Smoking status: Former Smoker    Types: Cigarettes  . Smokeless tobacco: Never Used  . Tobacco comment: Social smoker; last smoked in  August 2021  Substance and Sexual Activity  . Alcohol use: Not Currently    Comment: quit July 19, 2020  . Drug use: Never  . Sexual activity: Not on file  Other Topics Concern  . Not on file  Social History Narrative   Lives at home with 2 kids and boyfriend    Left handed   Caffeine: "not really"   Social Determinants of Health   Financial Resource Strain: Not on file  Food Insecurity: Not on file  Transportation Needs: Not on file  Physical Activity: Not on file  Stress: Not on file  Social Connections: Not on file  Intimate Partner Violence: Not on file    Family History  Problem Relation Age of Onset  . Irritable bowel syndrome Mother   . Irritable bowel syndrome Father   . Ulcers Father        Stomach Ulcers  . Migraines Paternal Grandmother   . Migraines Paternal Aunt   . Migraines Paternal Aunt     Past Medical History:  Diagnosis Date  . ADHD   . Anxiety   . Bipolar 1 disorder (HCC)   . Bladder spasms   . Chlamydia infection 06/2008  . IBS (irritable bowel syndrome)   . UTI (urinary tract infection)    used to get bad UTIs    Patient Active Problem List   Diagnosis Date Noted  . Pregnancy headache in second trimester 11/02/2020  . IBS (irritable bowel syndrome)   . Bipolar 1 disorder (HCC)   . Anxiety   . ADHD     Past Surgical History:  Procedure Laterality Date  . WISDOM TOOTH EXTRACTION      Current Outpatient Medications  Medication Sig Dispense Refill  . diphenhydrAMINE (BENADRYL) 50 MG capsule Take 50 mg by mouth in the morning, at noon, and at bedtime.    . nortriptyline (PAMELOR) 25 MG capsule Take 1 capsule (25 mg total) by mouth at bedtime. 30 capsule 3  . Prenatal Vit-Fe Fumarate-FA (PRENATAL PO) Take by mouth.    . loratadine (CLARITIN) 10 MG tablet Take one tablet by mouth once daily as directed. (Patient not taking: Reported on 12/30/2020) 30 tablet 11  . metoCLOPramide (REGLAN) 10 MG tablet Take 1 tablet (10 mg total) by mouth  3 (three) times daily as needed for nausea. (Patient not taking: Reported on 12/30/2020) 30 tablet 0  . ondansetron (ZOFRAN-ODT) 4 MG disintegrating tablet Take 4 mg by mouth as needed for nausea or vomiting. (Patient not taking: Reported on 12/30/2020)     No current facility-administered medications for this visit.    Allergies as of 12/30/2020 - Review Complete 12/30/2020  Allergen Reaction Noted  . Molds & smuts  12/02/2020    Vitals: BP 108/65 (BP Location: Right Arm, Patient Position: Sitting)   Pulse 76   Ht 5' 5.5" (1.664 m)   Wt 184 lb (83.5 kg)   LMP 05/10/2020   BMI 30.15 kg/m  Last Weight:  Wt Readings from Last 1 Encounters:  12/30/20 184 lb (83.5 kg)   Last Height:   Ht Readings from Last 1 Encounters:  12/30/20 5' 5.5" (1.664 m)  Exam: NAD, pleasant                  Speech:    Speech is normal; fluent and spontaneous with normal comprehension.  Cognition:    The patient is oriented to person, place, and time;     recent and remote memory intact;     language fluent;    Cranial Nerves:    The pupils are equal, round, and reactive to light. Fundi flat, vision intact. Trigeminal sensation is intact and the muscles of mastication are normal. The face is symmetric. The palate elevates in the midline. Hearing intact. Voice is normal. Shoulder shrug is normal. The tongue has normal motion without fasciculations.   Coordination:  No dysmetria  Motor Observation:    No asymmetry, no atrophy, and no involuntary movements noted. Tone:    Normal muscle tone.     Strength:    Strength is V/V in the upper and lower limbs.      Sensation: intact to LT     Assessment/Plan: This is a patient with an unusual constellation of headache symptoms, she is also [redacted] weeks pregnant, MRI of the brain showed no etiology. She did not follow through with MRI orbits/MRV because she states she is feeling better. At this time we had a long discussions about migraines and pregnancy,  start nortriptyline, reglan prn.   Meds ordered this encounter  Medications  . nortriptyline (PAMELOR) 25 MG capsule    Sig: Take 1 capsule (25 mg total) by mouth at bedtime.    Dispense:  30 capsule    Refill:  3       Cc: Lise Auer, MD,  Lise Auer, MD  Naomie Dean, MD  [facility] [address] [place], [place] [address]  Phone [phone] Fax [phone]  I spent over 30 minutes of face-to-face and non-face-to-face time with patient on the  1. Pregnancy headache in second trimester    diagnosis.  This included previsit chart review, lab review, study review, order entry, electronic health record documentation, patient education on the different diagnostic and therapeutic options, counseling and coordination of care, risks and benefits of management, compliance, or risk factor reduction

## 2021-01-02 ENCOUNTER — Encounter: Payer: Self-pay | Admitting: Neurology

## 2021-01-24 ENCOUNTER — Telehealth: Payer: Self-pay | Admitting: Neurology

## 2021-01-24 MED ORDER — NORTRIPTYLINE HCL 50 MG PO CAPS
50.0000 mg | ORAL_CAPSULE | Freq: Every day | ORAL | 5 refills | Status: DC
Start: 2021-01-24 — End: 2021-02-21

## 2021-01-24 MED ORDER — NORTRIPTYLINE HCL 50 MG PO CAPS
50.0000 mg | ORAL_CAPSULE | Freq: Every day | ORAL | 5 refills | Status: DC
Start: 2021-01-24 — End: 2021-01-24

## 2021-01-24 NOTE — Telephone Encounter (Addendum)
Nortriptyline 50 mg PO QHS #30 refills 5 sent to [facility].

## 2021-01-24 NOTE — Addendum Note (Signed)
Addended by: Bertram Savin on: 01/24/2021 05:28 PM   Modules accepted: Orders

## 2021-01-24 NOTE — Telephone Encounter (Signed)
Pt called today stating she has had a migraine since yesterday and she is not able to get relief. She asked if there was anything she can do since she is pregnant her options are limited. Best call back is [phone]

## 2021-01-24 NOTE — Telephone Encounter (Signed)
Spoke with patient. She stated the Nortriptyline was working really well for her. However in the last 2 days she has been ok in the day but then the pressure/headache comes back at night. She takes the Nortriptyline around 830 pm and is in bed by 10. She asks if she should take it earlier or if she should take a higher dose? Metoclopramide did not help plus she lost the bottle.   I spoke with Dr Lucia Gaskins. Continue same time but take 50 mg nightly instead of 25 mg. Spoke with patient and let her know. Also advised I would call in the prescription per Dr Lucia Gaskins to reflect these changes. Pt verbalized understanding and appreciation.

## 2021-01-27 ENCOUNTER — Telehealth: Payer: Self-pay | Admitting: *Deleted

## 2021-01-27 NOTE — Telephone Encounter (Signed)
DSS medical form completed, signed, and sent to medical records for processing. I spoke with Kennith Gain at [facility] DSS. She advised they currently do not have patient doing any activities in the Work [employer] because patient states she is having headaches and is seeing a neurologist. Aniceto Boss stated they need documentation as to why patient is being seen. I advised per Dr Lucia Gaskins we cannot attest to how much work she can do or not do as patient is pregnant. Alma stated this was fine. We have deferred some aspects of activity to OB.   Alma's number: [phone]  Fax number: [phone]

## 2021-02-21 ENCOUNTER — Telehealth: Payer: Self-pay | Admitting: Neurology

## 2021-02-21 ENCOUNTER — Other Ambulatory Visit: Payer: Self-pay | Admitting: Neurology

## 2021-02-21 MED ORDER — NORTRIPTYLINE HCL 25 MG PO CAPS
75.0000 mg | ORAL_CAPSULE | Freq: Every day | ORAL | 3 refills | Status: DC
Start: 2021-02-21 — End: 2021-06-16

## 2021-02-21 NOTE — Telephone Encounter (Signed)
Yes if she has any 25mg  pills let over she can add one and go up to 75mg  and I will send in a new prescription thanks

## 2021-02-21 NOTE — Telephone Encounter (Signed)
Spoke with patient and advised per Dr Lucia Gaskins to increase her dose to 75 mg. She can take leftover 25 mg capsules to add to the 50 mg until she gets her refill. Pt aware the new prescription is for 25 mg capsules and she will take 3. Pt to f/u in office on 3/28. She verbalized appreciation for the call.

## 2021-02-21 NOTE — Telephone Encounter (Signed)
Pt called, six days ago migraine, eye pressure came back. Can you increase my nortriptyline (PAMELOR) 50 MG capsule. Would like a call from the nurse.

## 2021-03-07 ENCOUNTER — Ambulatory Visit: Payer: Medicaid Other | Admitting: Neurology

## 2021-03-07 ENCOUNTER — Telehealth: Payer: Self-pay | Admitting: Neurology

## 2021-03-07 NOTE — Telephone Encounter (Signed)
This is FYI: Pt called asking to do a phone or virtual type appointment for today.  RN advised phone rep to message Dr Lucia Gaskins re: what she would prefer.  Phone rep received response from Dr Lucia Gaskins to reach out to pt  With option of video visit or to reschedule.  Phone rep called pt and left message asking pt to call with what she would like to do. Pt advised when she 1st called that she would need to set up mychart in order to do a my chart vv.

## 2021-03-07 NOTE — Telephone Encounter (Addendum)
I spoke with Dr. Lucia Gaskins verbally about this message. She sts she could see the pt via my chart but if the pt was doing well and is stable we could push her appt out and f/u with her in a few weeks.  Pt called and advised of message. She reported she is doing very well, meds are working and she no concerns at this time.  Pt was agreeable to rescheduling and has been moved to 04/18/21 at 1 pm. Pt will call back if any changes come up.

## 2021-03-07 NOTE — Progress Notes (Deleted)
[facility]    Provider:  Dr Lucia Gaskins Requesting Provider: Lise Auer, MD Primary Care Provider:  Lise Auer, MD  CC:  Ear pressure, nose pressure, headache, ear popping, dizzy, panic attacks  Interval history December 30, 2020: Patient returns, MRI of the brain was unremarkable, we reviewed images together and discussed. We discussed that she has a family history of migraines, this may be migraines or tension type headache is hard to know.  At this time we will start her on nortriptyline:We discussed options. Nortriptyline and reglan are considered low risk in pregnancy but no medication is entirely safe. There have been rare associations with Nortriptyline and limb anomalies and cardiac anomalies and other birth defects but this is less likely given you arein your 2nd trimester,  zofran may  help with nausea if needed. Tylenol is considered safe but there have been reports of birth defects even with tylenol, use sparingly.Marland Kitchen    MRI 11/12/2020: IMPRESSION:Unremarkable noncontrast MRI appearance of the brain. No evidence of acute intracranial abnormality.   HPI 11/02/2020:  Laurie Guzman is a 30 y.o. female here as requested by Lise Auer, MD for Migraine.  Past medical history severe anxiety with panic attacks, bipolar disorder.  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  She started getting panic attacks in June/july. With the panic attacks she started having headaches. But now the headaches occur more often, significant ear pressure, ear popping, sharp pains through her ears, pressure right at the sinus under the eyes, she went to ENT and they said everything was normal. Her grandmother says she calls and she is crying, she never had a history of migraines, she feels a lot of pressure in the head, significant dizziness, positional headache, episodes of blurry vision and peripheral vision loss, worse laying down she can;t even lay down it hurts so much and she gets dizzy she feels like she is moving or the room spins. Sounds bother her, light doesn't bother her, she gets nauseated. Most of the time the headaches are around the temples with throbbing. Grandmother has migraines and her aunt does too. She is [redacted] weeks pregnant. Eye pain as well on movement. She has monocular vision loss episodically in the right eye. No weakness. Blood pressure has been fine. No fever. No other focal neurologic deficits, associated symptoms, inciting events or modifiable factors.  Reviewed notes, labs and imaging from outside physicians, which showed:  See above  Labs: 10/14/2020 positive pregnancy test urine, hgb 13.5 01/02/2019,   Review of Systems: Patient complains of symptoms per HPI as well as the following symptoms: headache . Pertinent negatives and positives per HPI. All others negative    Social History   Socioeconomic History  . Marital status: Single    Spouse name: Not on file  .  Number of children: 2  . Years of education: Not on file  . Highest education level: Some college, no degree  Occupational History  . Not on file  Tobacco Use  . Smoking status: Former Smoker    Types: Cigarettes  . Smokeless tobacco: Never Used  . Tobacco comment: Social smoker; last smoked in  August 2021  Substance and Sexual Activity  . Alcohol use: Not Currently    Comment: quit July 19, 2020  . Drug use: Never  . Sexual activity: Not on file  Other Topics Concern  . Not on file  Social History Narrative   Lives at home with 2 kids and boyfriend    Left handed   Caffeine: "not really"   Social Determinants of Health   Financial Resource Strain: Not on file  Food Insecurity: Not on file  Transportation Needs: Not on file  Physical Activity: Not on file  Stress: Not on file  Social Connections: Not on file  Intimate Partner Violence: Not on file    Family History  Problem Relation Age of Onset  . Irritable bowel syndrome Mother   . Irritable bowel syndrome Father   . Ulcers Father        Stomach Ulcers  . Migraines Paternal Grandmother   . Migraines Paternal Aunt   . Migraines Paternal Aunt     Past Medical History:  Diagnosis Date  . ADHD   . Anxiety   . Bipolar 1 disorder (HCC)   . Bladder spasms   . Chlamydia infection 06/2008  . IBS (irritable bowel syndrome)   . UTI (urinary tract infection)    used to get bad UTIs    Patient Active Problem List   Diagnosis Date Noted  . Pregnancy headache in second trimester 11/02/2020  . IBS (irritable bowel syndrome)   . Bipolar 1 disorder (HCC)   . Anxiety   . ADHD     Past Surgical History:  Procedure Laterality Date  . WISDOM TOOTH EXTRACTION      Current Outpatient Medications  Medication Sig Dispense Refill  . diphenhydrAMINE (BENADRYL) 50 MG capsule Take 50 mg by mouth in the morning, at noon, and at bedtime.    Marland Kitchen loratadine (CLARITIN) 10 MG tablet Take one tablet by mouth once daily as directed. (Patient not taking: Reported on 12/30/2020) 30 tablet 11  . nortriptyline (PAMELOR) 25 MG capsule Take 3 capsules (75 mg total) by mouth at bedtime. 90 capsule 3  . ondansetron (ZOFRAN-ODT) 4 MG disintegrating tablet Take 4 mg by mouth as needed for nausea or vomiting. (Patient not taking: Reported  on 12/30/2020)    . Prenatal Vit-Fe Fumarate-FA (PRENATAL PO) Take by mouth.     No current facility-administered medications for this visit.    Allergies as of 03/07/2021 - Review Complete 12/30/2020  Allergen Reaction Noted  . Molds & smuts  12/02/2020    Vitals: LMP 05/10/2020  Last Weight:  Wt Readings from Last 1 Encounters:  12/30/20 184 lb (83.5 kg)   Last Height:   Ht Readings from Last 1 Encounters:  12/30/20 5' 5.5" (1.664 m)    Exam: NAD, pleasant                  Speech:    Speech is normal; fluent and spontaneous with normal comprehension.  Cognition:    The patient is oriented to person, place, and time;     recent and remote memory intact;     language fluent;  Cranial Nerves:    The pupils are equal, round, and reactive to light. Fundi flat, vision intact. Trigeminal sensation is intact and the muscles of mastication are normal. The face is symmetric. The palate elevates in the midline. Hearing intact. Voice is normal. Shoulder shrug is normal. The tongue has normal motion without fasciculations.   Coordination:  No dysmetria  Motor Observation:    No asymmetry, no atrophy, and no involuntary movements noted. Tone:    Normal muscle tone.     Strength:    Strength is V/V in the upper and lower limbs.      Sensation: intact to LT     Assessment/Plan: This is a patient with an unusual constellation of headache symptoms, she is also [redacted] weeks pregnant, MRI of the brain showed no etiology. She did not follow through with MRI orbits/MRV because she states she is feeling better. At this time we had a long discussions about migraines and pregnancy, start nortriptyline, reglan prn.   No orders of the defined types were placed in this encounter.      Cc: Lise Auer, MD,  Lise Auer, MD  Naomie Dean, MD  [facility] [address] [place], [place] [address]  Phone [phone] Fax [phone]  I spent over  30 minutes of face-to-face and non-face-to-face time with patient on the  No diagnosis found. diagnosis.  This included previsit chart review, lab review, study review, order entry, electronic health record documentation, patient education on the different diagnostic and therapeutic options, counseling and coordination of care, risks and benefits of management, compliance, or risk factor reduction

## 2021-04-01 ENCOUNTER — Telehealth: Payer: Self-pay | Admitting: Neurology

## 2021-04-01 NOTE — Telephone Encounter (Signed)
Pt would like a call from RN to discuss what her thyroid levels were when she was last seen.  Please call

## 2021-04-04 ENCOUNTER — Telehealth: Payer: Self-pay | Admitting: Allergy and Immunology

## 2021-04-04 NOTE — Telephone Encounter (Signed)
Called patient and told her that her thyroid levels were normal according to Dr. Kathyrn Lass message on her last blood tests in December 2021.

## 2021-04-04 NOTE — Telephone Encounter (Signed)
I called the patient and LVM (ok per DPR) advising her thyroid levels were not checked here but it appears Dr Kathyrn Lass office had checked them previously.  I advised the patient to call the allergy office.

## 2021-04-04 NOTE — Telephone Encounter (Addendum)
Her thyroid levels were not checked here at our office. Please have her call Dr Kathyrn Lass office (allergy center) as it appears he has checked them.

## 2021-04-04 NOTE — Telephone Encounter (Signed)
Patient called and would like to know what her thyroid levels were.

## 2021-04-11 DIAGNOSIS — E049 Nontoxic goiter, unspecified: Secondary | ICD-10-CM | POA: Insufficient documentation

## 2021-04-11 HISTORY — DX: Nontoxic goiter, unspecified: E04.9

## 2021-04-18 ENCOUNTER — Telehealth: Payer: Self-pay | Admitting: Neurology

## 2021-04-18 ENCOUNTER — Ambulatory Visit: Payer: Self-pay | Admitting: Neurology

## 2021-04-18 NOTE — Telephone Encounter (Signed)
FYI: Pt called, have to cancel my appt, do not have a babysitter.

## 2021-04-18 NOTE — Telephone Encounter (Signed)
Noted, Dr. Ahern aware.  

## 2021-05-10 ENCOUNTER — Telehealth: Payer: Self-pay

## 2021-05-10 NOTE — Telephone Encounter (Signed)
Patient left a voicemail today to discuss her medications for migraines.  She delivered her baby this past week.  Please call.

## 2021-05-12 NOTE — Telephone Encounter (Signed)
Spoke with Dr. Lucia Gaskins.  Patient can see me or Megan for an appointment to discuss migraine medications.  I called the patient and left voicemail asking for call back.  When she calls back, please schedule her for next available with Amy at 1 AM for Dr. Lucia Gaskins and if she is seen sooner then can cancel her September appointment.

## 2021-05-16 ENCOUNTER — Telehealth: Payer: Self-pay | Admitting: Neurology

## 2021-05-16 NOTE — Telephone Encounter (Signed)
Pt called back to speak to RN. Please call back when available.

## 2021-05-16 NOTE — Telephone Encounter (Signed)
I called the patient and LVM (ok per DPR) asking for call back.  Left office number in message.

## 2021-05-16 NOTE — Telephone Encounter (Signed)
Patient can see Aundra Millet or any.  I see an opening with Megan this Wednesday at 3 PM.  I called patient and left voicemail asking for call back ASAP.

## 2021-05-16 NOTE — Telephone Encounter (Signed)
Pt just had a baby on 05-27 she stopped taking  nortriptyline (PAMELOR) 25 MG capsule out of concern, she wants to know if it is safe to take while breast feeding, please call.

## 2021-05-17 NOTE — Telephone Encounter (Signed)
I called the patient.  The use of nortriptyline is safe during and after pregnancy, safe with breast-feeding.

## 2021-05-17 NOTE — Telephone Encounter (Addendum)
Patient returned my call.  She stated the nortriptyline was working well for her and she was not having any except for some "weird pregnancy headaches".  She stopped the nortriptyline approximately 2 weeks ago when she went in to the hospital to have the baby.  She stated in the last 4 to 5 days she has had a return of headaches with pain in her eyes and pounding out of her ear.  Ibuprofen and Tylenol barely doing anything.  She also reports she is a little dizzy.  She is breast-feeding and wonders if she can restart the nortriptyline.  Patient aware she may need an office visit and she has temporarily been scheduled.  I let her know Dr. Lucia Gaskins is currently out of the office and I would discuss with work in physician.  Patient was also advised that if she develops any headaches that are not like the ones she has had before, if anything is severe, or has new symptoms she should go to the hospital for evaluation to make sure that there are no post-pregnancy complications.  The patient verbalized understanding and appreciation.

## 2021-05-18 ENCOUNTER — Ambulatory Visit: Payer: Self-pay | Admitting: Adult Health

## 2021-06-12 ENCOUNTER — Other Ambulatory Visit: Payer: Self-pay | Admitting: Neurology

## 2021-08-11 ENCOUNTER — Ambulatory Visit: Payer: Medicaid Other | Admitting: Neurology

## 2021-08-11 VITALS — BP 118/74 | HR 82 | Ht 65.5 in | Wt 185.0 lb

## 2021-08-11 DIAGNOSIS — R7989 Other specified abnormal findings of blood chemistry: Secondary | ICD-10-CM

## 2021-08-11 DIAGNOSIS — Z79899 Other long term (current) drug therapy: Secondary | ICD-10-CM | POA: Diagnosis not present

## 2021-08-11 DIAGNOSIS — G43709 Chronic migraine without aura, not intractable, without status migrainosus: Secondary | ICD-10-CM

## 2021-08-11 MED ORDER — TOPIRAMATE 50 MG PO TABS: 50.0000 mg | ORAL_TABLET | Freq: Every evening | ORAL | 6 refills | Status: DC

## 2021-08-11 MED ORDER — CYCLOBENZAPRINE HCL 10 MG PO TABS: 10.0000 mg | ORAL_TABLET | Freq: Every day | ORAL | 3 refills | Status: DC

## 2021-08-11 NOTE — Patient Instructions (Addendum)
Stop the Nortriptyline at night. Decrease by one pill every 2-3 days until stopped After stopping Nortriptyline:  Start flexeril at bedtime for clenching  Start Topiramate  in the at bedtime  Teeth Grinding Teeth grinding is a common condition in which a person grinds or clenches his or her teeth. This condition is also called bruxism. You can grind or clench your teeth without knowing that you are doing it. This can lead to tooth damage and jaw pain. What are the causes? The cause of this condition is not known. However, teeth grinding may be associated with: Untreated dental problems. Teeth that do not line up correctly (malocclusion). Stress or worry. Earaches. Allergies. Some medicines. Upper respiratory infections. Sleep disorders. What increases the risk? This condition is more likely to develop in: Children. People who have a family history of teeth grinding. People who consume nicotine, caffeine, or excess amounts of alcohol before sleeping. People who feel stressed. What are the signs or symptoms? Symptoms associated with this condition include: Earaches. Teeth that are sensitive to heat, cold, and sweetness. Damaged teeth. Jaw pain or facial pain. Jaw problems, such as hearing a clicking sound when you open or close your mouth. Mild headaches. Muscle spasms. Trouble sleeping. Trouble eating. In some cases, there are no symptoms. How is this diagnosed? This condition is diagnosed with a medical history and dental exam. To rule out other conditions, you may also have tests, including: X-rays. Blood tests. How is this treated? There is no cure for this condition. Treatment can help control your symptoms and prevent further damage to your teeth. Treatment may include: A night guard. This dental appliance, sometimes referred to as an occlusal guard, is fitted to your teeth. The guard serves as a barrier between your top and bottom teeth so that you grind on the  night guard instead. A mouth guard. This may move your lower jaw forward (mandibular advancement device). Some mouth guards also work to correct teeth that do not line up correctly or stabilize teeth after correction, such as with retainers. Medication. In more severe cases, medication may be prescribed at bedtime to relax your jaw muscles. Relaxation techniques. Exercise, yoga, meditation, or hypnosis may be prescribed to reduce stress if this is causing you to grind your teeth. Sufficient sleep. Try to get 8 hours of sleep every night. As you lie in bed, try to keep your face, mouth, and jaw muscles relaxed. You may be told to avoid the following: Sleeping on your back. Try sleeping on your side or your abdomen instead. Chewing gum. Eating foods that are difficult to chew. Consuming alcohol, caffeine, and nicotine, especially before bedtime. Dehydration. Drink enough fluids to keep your urine pale yellow. Follow these instructions at home: General instructions  Your health care provider may do the following: Make you a night guard and give instructions on how to use it. Wear it as told by your health care provider. Prescribe over-the-counter and prescription medicines. Recommend applying a warm, wet cloth (warm compress) to your face to help relax your jaw muscles. Do this as told by your health care provider. Prescribe jaw exercises. Keep all follow-up visits. This is important. Contact a health care provider if: Your symptoms get worse. New symptoms appear. You have trouble eating. You have trouble opening your mouth. Summary Teeth grinding is a common condition in which a person grinds or clenches his or her teeth. Symptoms can include jaw problems, facial pain, sensitive teeth, mild headaches, or trouble eating or sleeping.  There is no cure for this condition. Treatment can help ease symptoms and prevent further damage to the teeth. Treatment options may include wearing a night  guard, wearing a mouth guard to correct teeth that do not line up correctly, taking medication, and finding ways to ease stress. This information is not intended to replace advice given to you by your health care provider. Make sure you discuss any questions you have with your health care provider. Document Revised: 04/06/2020 Document Reviewed: 04/06/2020 Elsevier Patient Education  2022 [employer] Migraine Headache A migraine headache is an intense, throbbing pain on one side or both sides of the head. Migraine headaches may also cause other symptoms, such as nausea, vomiting, and sensitivity to light and noise. A migraine headache can last from 4 hours to 3 days. Talk with your doctor about what things may bring on (trigger) your migraine headaches. What are the causes? The exact cause of this condition is not known. However, a migraine may be caused when nerves in the brain become irritated and release chemicals that cause inflammation of blood vessels. This inflammation causes pain. This condition may be triggered or caused by: Drinking alcohol. Smoking. Taking medicines, such as: Medicine used to treat chest pain (nitroglycerin). Birth control pills. Estrogen. Certain blood pressure medicines. Eating or drinking products that contain nitrates, glutamate, aspartame, or tyramine. Aged cheeses, chocolate, or caffeine may also be triggers. Doing physical activity. Other things that may trigger a migraine headache include: Menstruation. Pregnancy. Hunger. Stress. Lack of sleep or too much sleep. Weather changes. Fatigue. What increases the risk? The following factors may make you more likely to experience migraine headaches: Being a certain age. This condition is more common in people who are 9-41 years old. Being female. Having a family history of migraine headaches. Being Caucasian. Having a mental health condition, such as depression or anxiety. Being obese. What are the  signs or symptoms? The main symptom of this condition is pulsating or throbbing pain. This pain may: Happen in any area of the head, such as on one side or both sides. Interfere with daily activities. Get worse with physical activity. Get worse with exposure to bright lights or loud noises. Other symptoms may include: Nausea. Vomiting. Dizziness. General sensitivity to bright lights, loud noises, or smells. Before you get a migraine headache, you may get warning signs (an aura). An aura may include: Seeing flashing lights or having blind spots. Seeing bright spots, halos, or zigzag lines. Having tunnel vision or blurred vision. Having numbness or a tingling feeling. Having trouble talking. Having muscle weakness. Some people have symptoms after a migraine headache (postdromal phase), such as: Feeling tired. Difficulty concentrating. How is this diagnosed? A migraine headache can be diagnosed based on: Your symptoms. A physical exam. Tests, such as: CT scan or an MRI of the head. These imaging tests can help rule out other causes of headaches. Taking fluid from the spine (lumbar puncture) and analyzing it (cerebrospinal fluid analysis, or CSF analysis). How is this treated? This condition may be treated with medicines that: Relieve pain. Relieve nausea. Prevent migraine headaches. Treatment for this condition may also include: Acupuncture. Lifestyle changes like avoiding foods that trigger migraine headaches. Biofeedback. Cognitive behavioral therapy. Follow these instructions at home: Medicines Take over-the-counter and prescription medicines only as told by your health care provider. Ask your health care provider if the medicine prescribed to you: Requires you to avoid driving or using heavy machinery. Can cause constipation. You may need to take  these actions to prevent or treat constipation: Drink enough fluid to keep your urine pale yellow. Take over-the-counter or  prescription medicines. Eat foods that are high in fiber, such as beans, whole grains, and fresh fruits and vegetables. Limit foods that are high in fat and processed sugars, such as fried or sweet foods. Lifestyle Do not drink alcohol. Do not use any products that contain nicotine or tobacco, such as cigarettes, e-cigarettes, and chewing tobacco. If you need help quitting, ask your health care provider. Get at least 8 hours of sleep every night. Find ways to manage stress, such as meditation, deep breathing, or yoga. General instructions   Keep a journal to find out what may trigger your migraine headaches. For example, write down: What you eat and drink. How much sleep you get. Any change to your diet or medicines. If you have a migraine headache: Avoid things that make your symptoms worse, such as bright lights. It may help to lie down in a dark, quiet room. Do not drive or use heavy machinery. Ask your health care provider what activities are safe for you while you are experiencing symptoms. Keep all follow-up visits as told by your health care provider. This is important. Contact a health care provider if: You develop symptoms that are different or more severe than your usual migraine headache symptoms. You have more than 15 headache days in one month. Get help right away if: Your migraine headache becomes severe. Your migraine headache lasts longer than 72 hours. You have a fever. You have a stiff neck. You have vision loss. Your muscles feel weak or like you cannot control them. You start to lose your balance often. You have trouble walking. You faint. You have a seizure. Summary A migraine headache is an intense, throbbing pain on one side or both sides of the head. Migraines may also cause other symptoms, such as nausea, vomiting, and sensitivity to light and noise. This condition may be treated with medicines and lifestyle changes. You may also need to avoid certain  things that trigger a migraine headache. Keep a journal to find out what may trigger your migraine headaches. Contact your health care provider if you have more than 15 headache days in a month or you develop symptoms that are different or more severe than your usual migraine headache symptoms. This information is not intended to replace advice given to you by your health care provider. Make sure you discuss any questions you have with your health care provider. Document Revised: 03/21/2019 Document Reviewed: 01/09/2019 Elsevier Patient Education  2022 [employer]. Cyclobenzaprine Tablets What is this medication? CYCLOBENZAPRINE (sye kloe BEN za preen) treats muscle spasms. It works by relaxing your muscles, which reduces muscle stiffness. It belongs to a group of medications called muscle relaxants. This medicine may be used for other purposes; ask your health care provider or pharmacist if you have questions. COMMON BRAND NAME(S): Fexmid, Flexeril What should I tell my care team before I take this medication? They need to know if you have any of these conditions: Heart disease, irregular heartbeat, or previous heart attack Liver disease Thyroid problem An unusual or allergic reaction to cyclobenzaprine, tricyclic antidepressants, lactose, other medications, foods, dyes, or preservatives Pregnant or trying to get pregnant Breast-feeding How should I use this medication? Take this medication by mouth with a glass of water. Follow the directions on the prescription label. If this medication upsets your stomach, take it with food or milk. Take your medication at regular  intervals. Do not take it more often than directed. Talk to your care team about the use of this medication in children. Special care may be needed. Overdosage: If you think you have taken too much of this medicine contact a poison control center or emergency room at once. NOTE: This medicine is only for you. Do not share this  medicine with others. What if I miss a dose? If you miss a dose, take it as soon as you can. If it is almost time for your next dose, take only that dose. Do not take double or extra doses. What may interact with this medication? Do not take this medication with any of the following: MAOIs like Carbex, Eldepryl, Marplan, Nardil, and Parnate Narcotic medications for cough Safinamide This medication may also interact with the following: Alcohol Bupropion Antihistamines for allergy, cough and cold Certain medications for anxiety or sleep Certain medications for bladder problems like oxybutynin, tolterodine Certain medications for depression like amitriptyline, fluoxetine, sertraline Certain medications for Parkinson's disease like benztropine, trihexyphenidyl Certain medications for seizures like phenobarbital, primidone Certain medications for stomach problems like dicyclomine, hyoscyamine Certain medications for travel sickness like scopolamine General anesthetics like halothane, isoflurane, methoxyflurane, propofol Ipratropium Local anesthetics like lidocaine, pramoxine, tetracaine Medications that relax muscles for surgery Narcotic medications for pain Phenothiazines like chlorpromazine, mesoridazine, prochlorperazine, thioridazine Verapamil This list may not describe all possible interactions. Give your health care provider a list of all the medicines, herbs, non-prescription drugs, or dietary supplements you use. Also tell them if you smoke, drink alcohol, or use illegal drugs. Some items may interact with your medicine. What should I watch for while using this medication? Tell your care team if your symptoms do not start to get better or if they get worse. You may get drowsy or dizzy. Do not drive, use machinery, or do anything that needs mental alertness until you know how this medication affects you. Do not stand or sit up quickly, especially if you are an older patient. This  reduces the risk of dizzy or fainting spells. Alcohol may interfere with the effect of this medication. Avoid alcoholic drinks. If you are taking another medication that also causes drowsiness, you may have more side effects. Give your care team a list of all medications you use. Your care team will tell you how much medication to take. Do not take more medication than directed. Call emergency for help if you have problems breathing or unusual sleepiness. Your mouth may get dry. Chewing sugarless gum or sucking hard candy, and drinking plenty of water may help. Contact your care team if the problem does not go away or is severe. What side effects may I notice from receiving this medication? Side effects that you should report to your care team as soon as possible: Allergic reactions-skin rash, itching, hives, swelling of the face, lips, tongue, or throat CNS depression-slow or shallow breathing, shortness of breath, feeling faint, dizziness, confusion, trouble staying awake Heart rhythm changes-fast or irregular heartbeat, dizziness, feeling faint or lightheaded, chest pain, trouble breathing Side effects that usually do not require medical attention (report to your care team if they continue or are bothersome): Constipation Dizziness Drowsiness Dry mouth Fatigue Nausea This list may not describe all possible side effects. Call your doctor for medical advice about side effects. You may report side effects to FDA at 1-800-FDA-1088. Where should I keep my medication? Keep out of the reach of children. Store at room temperature between 15 and 30 degrees C (59  and 86 degrees F). Keep container tightly closed. Throw away any unused medication after the expiration date. NOTE: This sheet is a summary. It may not cover all possible information. If you have questions about this medicine, talk to your doctor, pharmacist, or health care provider.  2022 Elsevier/Gold Standard (2021-03-10  09:41:19) Topiramate Tablets What is this medication? TOPIRAMATE (toe PYRE a mate) prevents and controls seizures in people with epilepsy. It may also be used to prevent migraine headaches. It works by calming overactive nerves in your body. This medicine may be used for other purposes; ask your health care provider or pharmacist if you have questions. COMMON BRAND NAME(S): Topamax, Topiragen What should I tell my care team before I take this medication? They need to know if you have any of these conditions: Bleeding disorder Kidney disease Lung disease Suicidal thoughts, plans or attempt An unusual or allergic reaction to topiramate, other medications, foods, dyes, or preservatives Pregnant or trying to get pregnant Breast-feeding How should I use this medication? Take this medication by mouth with water. Take it as directed on the prescription label at the same time every day. Do not cut, crush or chew this medicine. Swallow the tablets whole. You can take it with or without food. If it upsets your stomach, take it with food. Keep taking it unless your care team tells you to stop. A special MedGuide will be given to you by the pharmacist with each prescription and refill. Be sure to read this information carefully each time. Talk to your care team about the use of this medication in children. While it may be prescribed for children as young as 2 years for selected conditions, precautions do apply. Overdosage: If you think you have taken too much of this medicine contact a poison control center or emergency room at once. NOTE: This medicine is only for you. Do not share this medicine with others. What if I miss a dose? If you miss a dose, take it as soon as you can unless it is within 6 hours of the next dose. If it is within 6 hours of the next dose, skip the missed dose. Take the next dose at the normal time. Do not take double or extra doses. What may interact with this  medication? Acetazolamide Alcohol Antihistamines for allergy, cough, and cold Aspirin and aspirin-like medications Atropine Birth control pills Certain medications for anxiety or sleep Certain medications for bladder problems like oxybutynin, tolterodine Certain medications for depression like amitriptyline, fluoxetine, sertraline Certain medications for Parkinson's disease like benztropine, trihexyphenidyl Certain medications for seizures like carbamazepine, lamotrigine, phenobarbital, phenytoin, primidone, valproic acid, zonisamide Certain medications for stomach problems like dicyclomine, hyoscyamine Certain medications for travel sickness like scopolamine Certain medications that treat or prevent blood clots like warfarin, enoxaparin, dalteparin, apixaban, dabigatran, and rivaroxaban Digoxin Diltiazem General anesthetics like halothane, isoflurane, methoxyflurane, propofol Glyburide Hydrochlorothiazide Ipratropium Lithium Medications that relax muscles Metformin Narcotic medications for pain NSAIDs, medications for pain and inflammation, like ibuprofen or naproxen Phenothiazines like chlorpromazine, mesoridazine, prochlorperazine, thioridazine Pioglitazone This list may not describe all possible interactions. Give your health care provider a list of all the medicines, herbs, non-prescription drugs, or dietary supplements you use. Also tell them if you smoke, drink alcohol, or use illegal drugs. Some items may interact with your medicine. What should I watch for while using this medication? Visit your care team for regular checks on your progress. Tell your care team if your symptoms do not start to get better or if  they get worse. Do not suddenly stop taking this medication. You may develop a severe reaction. Your care team will tell you how much medication to take. If your care team wants you to stop the medication, the dose may be slowly lowered over time to avoid any side  effects. Wear a medical ID bracelet or chain. Carry a card that describes your condition. List the medications and doses you take on the card. You may get drowsy or dizzy. Do not drive, use machinery, or do anything that needs mental alertness until you know how this medication affects you. Do not stand up or sit up quickly, especially if you are an older patient. This reduces the risk of dizzy or fainting spells. Alcohol may interfere with the effects of this medication. Avoid alcoholic drinks. This medication may cause serious skin reactions. They can happen weeks to months after starting the medication. Contact your care team right away if you notice fevers or flu-like symptoms with a rash. The rash may be red or purple and then turn into blisters or peeling of the skin. Or, you might notice a red rash with swelling of the face, lips or lymph nodes in your neck or under your arms. Watch for new or worsening thoughts of suicide or depression. This includes sudden changes in mood, behaviors, or thoughts. These changes can happen at any time but are more common in the beginning of treatment or after a change in dose. Call your care team right away if you experience these thoughts or worsening depression. This medication may slow your child's growth if it is taken for a long time at high doses. Your care team will monitor your child's growth. Using this medication for a long time may weaken your bones. The risk of bone fractures may be increased. Talk to your care team about your bone health. Do not become pregnant while taking this medication. Hormone forms of birth control may not work as well with this medication. Talk to your care team about other forms of birth control. There is potential for serious harm to an unborn child. Tell your care team right away if you think you might be pregnant. What side effects may I notice from receiving this medication? Side effects that you should report to your care  team as soon as possible: Allergic reactions-skin rash, itching, hives, swelling of the face, lips, tongue, or throat High acid level-trouble breathing, unusual weakness or fatigue, confusion, headache, fast or irregular heartbeat, nausea, vomiting High ammonia level-unusual weakness or fatigue, confusion, loss of appetite, nausea, vomiting, seizures Fever that does not go away, decrease in sweat Kidney stones-blood in the urine, pain or trouble passing urine, pain in the lower back or sides Redness, blistering, peeling or loosening of the skin, including inside the mouth Sudden eye pain or change in vision such as blurry vision, seeing halos around lights, vision loss Thoughts of suicide or self-harm, worsening mood, feelings of depression Side effects that usually do not require medical attention (report to your care team if they continue or are bothersome): Burning or tingling sensation in hands or feet Difficulty with paying attention, memory, or speech Dizziness Drowsiness Fatigue Loss of appetite with weight loss Slow or sluggish movements of the body This list may not describe all possible side effects. Call your doctor for medical advice about side effects. You may report side effects to FDA at 1-800-FDA-1088. Where should I keep my medication? Keep out of the reach of children and pets. Store  between 15 and 30 degrees C (59 and 86 degrees F). Protect from moisture. Keep the container tightly closed. Get rid of any unused medication after the expiration date. To get rid of medications that are no longer needed or have expired: Take the medication to a medication take-back program. Check with your pharmacy or law enforcement to find a location. If you cannot return the medication, check the label or package insert to see if the medication should be thrown out in the garbage or flushed down the toilet. If you are not sure, ask your care team. If it is safe to put it in the trash, empty  the medication out of the container. Mix the medication with cat litter, dirt, coffee grounds, or other unwanted substance. Seal the mixture in a bag or container. Put it in the trash. NOTE: This sheet is a summary. It may not cover all possible information. If you have questions about this medicine, talk to your doctor, pharmacist, or health care provider.  2022 Elsevier/Gold Standard (2021-01-18 12:37:42)

## 2021-08-11 NOTE — Progress Notes (Signed)
[facility]    Provider:  Dr Lucia Gaskins Requesting Provider: Lise Auer, MD Primary Care Provider:  Lise Auer, MD  CC:  Ear pressure, nose pressure, headache, ear popping, dizzy, panic attacks  Symptoms did get better, now feel jaw pain with radiation to the eye. On both sides, she can wake up with it, she has palpitations. She has headaches all days, comes and goes all day, on both sides, light sensitivity, sound sensitivity, needs a dark room and nausea. Propranolol is contraindicated due to hypotension. Nortriptyline helping the headaches. She states pcp told her palpitations may be due to nortriptyline but it has been helping her, discussed let's decrease pamelor. Could consider propranolol if she gets a cardiac workup for the palpitations and no arrythmias. She has popping in her ears. I would start a low dose Topiramate. We discussed other options, new treatments, cgrp, botox, answered all questions  Interval history December 30, 2020: Patient returns, MRI of the brain was unremarkable, we reviewed images together and discussed. We discussed that she has a family history of migraines, this may be migraines or tension type headache is hard to know.  At this time we will start her on nortriptyline:We discussed options. Nortriptyline and reglan are considered low risk in pregnancy but no medication is entirely safe. There have been rare associations with Nortriptyline and limb anomalies and cardiac anomalies and other birth defects but this is less likely given you arein your 2nd trimester,  zofran may  help with nausea if needed. Tylenol is considered safe but there have been reports of birth defects even with tylenol, use sparingly.Marland Kitchen    MRI 11/12/2020: IMPRESSION:Unremarkable noncontrast MRI appearance of the brain. No evidence of acute intracranial abnormality.    HPI 11/02/2020:  Laurie Guzman is a 30 y.o. female here as requested by Lise Auer, MD for Migraine.   Past medical history severe anxiety with panic attacks, bipolar disorder.  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  She started getting panic attacks in June/july. With the panic attacks she started having headaches. But now the headaches occur more often, significant ear pressure, ear popping, sharp pains through her ears, pressure right at the sinus under the eyes, she went to ENT and they said everything was normal. Her grandmother says she calls and she is crying, she never had a history of migraines, she feels a lot of pressure in the head, significant dizziness, positional headache, episodes of blurry vision and peripheral vision loss, worse laying down she can;t even lay down it hurts so much and she gets dizzy she feels like she is moving or the room spins. Sounds bother her, light doesn't bother her, she gets nauseated. Most of the time the headaches are around the temples with throbbing. Grandmother has migraines and her aunt  does too. She is [redacted] weeks pregnant. Eye pain as well on movement. She has monocular vision loss episodically in the right eye. No weakness. Blood pressure has been fine. No fever. No other focal neurologic deficits, associated symptoms, inciting events or modifiable factors.  Reviewed  notes, labs and imaging from outside physicians, which showed:  See above  Labs: 10/14/2020 positive pregnancy test urine, hgb 13.5 01/02/2019,   Review of Systems: Patient complains of symptoms per HPI as well as the following symptoms: ear popping . Pertinent negatives and positives per HPI. All others negative     Social History   Socioeconomic History   Marital status: Single    Spouse name: Not on file   Number of children: 2   Years of education: Not on file   Highest education level: Some college, no degree  Occupational History   Not on file  Tobacco Use   Smoking status: Former    Types: Cigarettes   Smokeless tobacco: Never   Tobacco comments:    Social smoker; last smoked in August 2021  Substance and Sexual Activity   Alcohol use: Not Currently    Comment: quit July 19, 2020   Drug use: Never   Sexual activity: Not on file  Other Topics Concern   Not on file  Social History Narrative   Lives at home with 2 kids and boyfriend    Left handed   Caffeine: "not really"   Social Determinants of Health   Financial Resource Strain: Not on file  Food Insecurity: Not on file  Transportation Needs: Not on file  Physical Activity: Not on file  Stress: Not on file  Social Connections: Not on file  Intimate Partner Violence: Not on file    Family History  Problem Relation Age of Onset   Irritable bowel syndrome Mother    Irritable bowel syndrome Father    Ulcers Father        Stomach Ulcers   Migraines Paternal Grandmother    Migraines Paternal Aunt    Migraines Paternal Aunt     Past Medical History:  Diagnosis Date   ADHD    Anxiety    Bipolar 1 disorder (HCC)    Bladder spasms    Chlamydia infection 06/2008   IBS (irritable bowel syndrome)    UTI (urinary tract infection)    used to get bad UTIs    Patient Active Problem List   Diagnosis Date Noted   Chronic migraine without aura without status migrainosus, not intractable 08/15/2021    Pregnancy headache in second trimester 11/02/2020   IBS (irritable bowel syndrome)    Bipolar 1 disorder (HCC)    Anxiety    ADHD     Past Surgical History:  Procedure Laterality Date   WISDOM TOOTH EXTRACTION      Current Outpatient Medications  Medication Sig Dispense Refill   cyclobenzaprine (FLEXERIL) 10 MG tablet Take 1 tablet (10 mg total) by mouth at bedtime. 30 tablet 3   topiramate (TOPAMAX) 50 MG tablet Take 1 tablet (50 mg total) by mouth at bedtime. 30 tablet 6   XULANE 150-35 MCG/24HR transdermal patch 1 patch once a week.     No current facility-administered medications for this visit.    Allergies as of 08/11/2021 - Review Complete 08/11/2021  Allergen Reaction Noted   Molds & smuts  12/02/2020    Vitals: BP 118/74   Pulse 82   Ht 5' 5.5" (1.664 m)   Wt 185 lb (83.9  kg)   BMI 30.32 kg/m  Last Weight:  Wt Readings from Last 1 Encounters:  08/11/21 185 lb (83.9 kg)   Last Height:   Ht Readings from Last 1 Encounters:  08/11/21 5' 5.5" (1.664 m)   Exam: NAD, pleasant                  Speech:    Speech is normal; fluent and spontaneous with normal comprehension.  Cognition:    The patient is oriented to person, place, and time;     recent and remote memory intact;     language fluent;    Cranial Nerves:    The pupils are equal, round, and reactive to light. Fundi flat.Trigeminal sensation is intact and the muscles of mastication are normal. The face is symmetric. The palate elevates in the midline. Hearing intact. Voice is normal. Shoulder shrug is normal. The tongue has normal motion without fasciculations.   Coordination:  No dysmetria  Motor Observation:    No asymmetry, no atrophy, and no involuntary movements noted. Tone:    Normal muscle tone.     Strength:    Strength is V/V in the upper and lower limbs.      Sensation: intact to LT       Assessment/Plan: patient with migraines, initially seen when pregnant and started  nortriptyline. She also has anxiey and panic attacks and states she is having palpitations. We will titrate off of nortriptyline and start a low dose topamax, discussed teratogenicity do not get pregnant. MRI of the brain showed no etiology. We had a long discussions about migraine management.   Stop the Nortriptyline at night. Decrease by one pill every 2-3 days until stopped After stopping Nortriptyline:  Start flexeril at bedtime for clenching 10mg  which can be contributing to her symptoms of headache and jaw pain Start Topiramate 50mg  at bedtime. Send me mychart message how it is going. Will check thyroid (Results normal TSH and normal FT4)  Meds ordered this encounter  Medications   topiramate (TOPAMAX) 50 MG tablet    Sig: Take 1 tablet (50 mg total) by mouth at bedtime.    Dispense:  30 tablet    Refill:  6   cyclobenzaprine (FLEXERIL) 10 MG tablet    Sig: Take 1 tablet (10 mg total) by mouth at bedtime.    Dispense:  30 tablet    Refill:  3        Cc: , MD,  , MD  Lise Auer, MD  [facility] [address] [place], [address] [place]  Phone [phone] Fax [phone]  I spent over 30 minutes of face-to-face and non-face-to-face time with patient on the  1. Chronic migraine without aura without status migrainosus, not intractable   2. Abnormal thyroid blood test     diagnosis.  This included previsit chart review, lab review, study review, order entry, electronic health record documentation, patient education on the different diagnostic and therapeutic options, counseling and coordination of care, risks and benefits of management, compliance, or risk factor reduction

## 2021-08-12 DIAGNOSIS — Z79899 Other long term (current) drug therapy: Secondary | ICD-10-CM

## 2021-08-12 LAB — T4, FREE: Free T4: 1.15 ng/dL (ref 0.82–1.77)

## 2021-08-12 LAB — TSH: TSH: 0.642 u[IU]/mL (ref 0.450–4.500)

## 2021-08-15 ENCOUNTER — Encounter: Payer: Self-pay | Admitting: Neurology

## 2021-08-15 DIAGNOSIS — G43709 Chronic migraine without aura, not intractable, without status migrainosus: Secondary | ICD-10-CM | POA: Insufficient documentation

## 2021-08-15 HISTORY — DX: Chronic migraine without aura, not intractable, without status migrainosus: G43.709

## 2021-08-16 ENCOUNTER — Telehealth: Payer: Self-pay | Admitting: *Deleted

## 2021-08-16 ENCOUNTER — Other Ambulatory Visit: Payer: Self-pay | Admitting: Neurology

## 2021-08-16 MED ORDER — NARATRIPTAN HCL 2.5 MG PO TABS: 2.5000 mg | ORAL_TABLET | ORAL | 6 refills | Status: DC | PRN

## 2021-08-16 NOTE — Telephone Encounter (Signed)
I called patient relayed Dr. Trevor Mace message that there were multiple options for her to try Amerge(naratriptan), take one onset of migraine may take another 2 hours later if needed, this was called into pharmacy (can take multiple days in row), max 2 in day). Other option medrol dosepack or toradol injection.  She is breastfeeding. She will check with pharmacy as safety precaution.  She had rough weekend and did not want to go to ED due to having baby with her and (possible exposure to other illness).  I relayed that was a good idea.    Her pcp did give her samples of ubrelvy and she will try 2nd dose today, and will go to [employer] tomorrow if needed.  She will also call back if needed as well.

## 2021-08-16 NOTE — Telephone Encounter (Signed)
-----   Message from Anson Fret, MD sent at 08/12/2021  8:18 AM EDT ----- Thyroid studies normal thanks

## 2021-08-25 ENCOUNTER — Other Ambulatory Visit: Payer: Self-pay | Admitting: *Deleted

## 2021-08-25 MED ORDER — NARATRIPTAN HCL 2.5 MG PO TABS: 2.5000 mg | ORAL_TABLET | ORAL | 6 refills | Status: DC | PRN

## 2021-08-30 ENCOUNTER — Other Ambulatory Visit: Payer: Self-pay | Admitting: Neurology

## 2021-08-30 MED ORDER — SUMATRIPTAN SUCCINATE 100 MG PO TABS: 100.0000 mg | ORAL_TABLET | Freq: Once | ORAL | 12 refills | Status: DC | PRN

## 2021-08-30 NOTE — Telephone Encounter (Signed)
I called patient.  She states she has right ear sharp shooting electrical type pains also headache pain in her temple that radiates  from her right ear down to her jaw to both sides of her neck.  She states even into her back.  She said she has been in touch with her family doctor and they ordered an MRI of her neck cervical.  She states the cyclobenzaprine that she takes at night does not help ,Bernita Raisin did nothing for her, Topamax is not helping.  Were questioning the CGRP injectable if its okay to use with a 66-month-old breast-feeding baby.  She got some information from talking to someone else about her having an epidural when her baby was born and is questioning if that could be causing the symptoms that she is having.  Is there another acute drug that may help since the naratritpan has only helped for one hour, (even taking 2 tabs).

## 2021-08-31 MED ORDER — EMGALITY 120 MG/ML ~~LOC~~ SOAJ: 240.0000 mg | Freq: Once | SUBCUTANEOUS | 0 refills | Status: AC

## 2021-08-31 MED ORDER — EMGALITY 120 MG/ML ~~LOC~~ SOAJ: 120.0000 mg | SUBCUTANEOUS | 5 refills | Status: DC

## 2021-08-31 NOTE — Telephone Encounter (Signed)
Order for loading dose of Emgality 240 mg x 1 placed followed by 120 mg every 30 days. Order placed for pregnancy test.

## 2021-08-31 NOTE — Addendum Note (Signed)
Addended by: Bertram Savin on: 08/31/2021 08:21 AM   Modules accepted: Orders

## 2021-08-31 NOTE — Addendum Note (Signed)
Addended by: Rogene Houston on: 08/31/2021 11:49 AM   Modules accepted: Orders

## 2021-09-01 ENCOUNTER — Telehealth: Payer: Self-pay | Admitting: *Deleted

## 2021-09-01 LAB — PREGNANCY, URINE: Preg Test, Ur: NEGATIVE

## 2021-09-01 NOTE — Telephone Encounter (Signed)
Initiated [employer] PA on Cover My Meds but currently waiting on clinical questions. Key: [id number].

## 2021-09-05 NOTE — Telephone Encounter (Signed)
Received fax from [facility] that patient did not have active coverage. I submitted a PA request on  [employer], Prior Approval [id number] and received approval.   Effective 09/05/2021 - 12/04/2021.

## 2021-09-06 NOTE — Telephone Encounter (Signed)
Spoke with pt. She will see Aundra Millet NP tomorrow at 9:00 AM.

## 2021-09-07 ENCOUNTER — Encounter: Payer: Self-pay | Admitting: Adult Health

## 2021-09-07 ENCOUNTER — Ambulatory Visit: Payer: Medicaid Other | Admitting: Adult Health

## 2021-09-12 ENCOUNTER — Encounter: Payer: Self-pay | Admitting: Neurology

## 2021-11-14 ENCOUNTER — Telehealth: Payer: Medicaid Other | Admitting: Neurology

## 2021-11-23 ENCOUNTER — Encounter: Payer: Self-pay | Admitting: Neurology

## 2022-02-17 NOTE — Progress Notes (Signed)
? ?NEUROLOGY CONSULTATION NOTE ? ?Laurie Guzman ?MRN: [id number] ?DOB: 1991/06/26 ? ?Referring provider: Luna Kitchens, MD ?Primary care provider: Luna Kitchens, MD ? ?Reason for consult:  migraines ? ?Assessment/Plan:  ? ?Migraine without aura, without status migrainosus, not intractable ?Dizziness ? ?She plans on breastfeeding for another 2 1/2 to 3 months.  Until then, we will treat headaches as such: ?Migraine prevention:  increase magnesium to magnesium citrate 400mg  daily ?Migraine rescue:  acetaminophen or ibuprofen ?Limit use of pain relievers to no more than 2 days out of week to prevent risk of rebound or medication-overuse headache. ?Keep headache diary ?Recommend seeing a dentist about a bite guard. ?Follow up 4-5 months. ? ? ? ?Subjective:  ?Laurie Guzman is a 31 year old female with Bipolar disorder and generalized anxiety disorder who presents for migraines.  History supplemented by prior neurologist's and referring provider's notes. ? ?15- 20 minutes infrequent ? ? ?Onset:  28 ? ?Headache 1: ?Paroxysmal stabbing headache in various locations, lasting seconds ? ?Headache 2: ?Severe nonthrobbing occipital pressure ? ?Headache 3: ?Intensity:  severe ?Location:  diffuse, neck pain ?Quality:  throbbing ?Aura:  absent ?Prodrome:  absent ?Associated symptoms:  Blurred vision, dizziness, nausea, photophobia, phonophobia, aural fullness.  She denies associated unilateral numbness or weakness. ?Duration:  1 to 3 days ?Frequency:  once to twice a month (much more frequent before starting magnesium) ?Triggers:  unknown ?Relieving factors:  rest ?Activity:  aggravates ? ?Currently breastfeeding. ? ?MRI of brain without contrast on 11/12/2020 personally reviewed was unremarkable.  MRV of head on 11/29/2020 was normal.   ? ?Rescue protocol:  1000mg  Tylenol with Benadryl; ibuprofen ?Current NSAIDS/analgesics:  Tylenol, ibuprofen ?Current triptans:  contraindicated (anaphylaxis) ?Current ergotamine:  none ?Current  anti-emetic:  none ?Current muscle relaxants:  cyclobenzaprine 10mg  QHS ?Current Antihypertensive medications:  none ?Current Antidepressant medications:  none ?Current Anticonvulsant medications:  none ?Current anti-CGRP:  Emgality ?Current Vitamins/Herbal/Supplements:  magnesium, D ?Current Antihistamines/Decongestants:  none ?Other therapy:  none ?Hormone/birth control:  none ? ? ?Past NSAIDS/analgesics:  none ?Past abortive triptans:  naratriptan 2.5mg , sumatriptan 100mg  ?Past abortive ergotamine:  none ?Past muscle relaxants:  none ?Past anti-emetic:  Zofran ?Past antihypertensive medications:  beta blockers contraindicated due to hypotension ?Past antidepressant medications:  nortriptyline 50mg , sertraline, paroxetine ?Past anticonvulsant medications:  topiramate 50mg  QHS ?Past anti-CGRP:  none ?Past vitamins/Herbal/Supplements:  none ?Past antihistamines/decongestants:  Claritin ?Other past therapies:  none ? ?Caffeine:  no - causes anxiety. ?Alcohol:  occasional - every 2 weeks ?Smoker:  no ?Diet:  No soda (sometimes Sprite).  Drinks water and juice. ?Exercise:  not recently.  Feels tired. ?Depression:  No; Anxiety:  Yes.  Has history of panic attacks ?Other pain:  Neck pain.  She reports bilateral jaw pain.  Endorses grinding teeth or biting down.   ?Sleep hygiene:  Good except for getting up to breastfeed. 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   ?Family history of headache:  paternal aunts, maternal grandmother ? ?  ? ? ?PAST MEDICAL HISTORY: ?Past Medical History:  ?Diagnosis Date  ? ADHD   ? Anxiety   ? Bipolar 1 disorder (HCC)   ? Bladder spasms   ? Chlamydia infection 06/2008  ? IBS (irritable bowel syndrome)   ? UTI (urinary tract infection)   ? used to get bad UTIs  ? ? ?PAST SURGICAL HISTORY: ?Past Surgical History:  ?Procedure Laterality Date  ? WISDOM TOOTH EXTRACTION    ? ? ?MEDICATIONS: ?Current Outpatient Medications on File  Prior to Visit  ?Medication Sig Dispense  Refill  ? cyclobenzaprine (FLEXERIL) 10 MG tablet Take 1 tablet (10 mg total) by mouth at bedtime. 30 tablet 3  ? Galcanezumab-gnlm (EMGALITY) 120 MG/ML SOAJ Inject 120 mg into the skin every 30 (thirty) days. 1 mL 5  ? naratriptan (AMERGE) 2.5 MG tablet Take 1 tablet (2.5 mg total) by mouth as needed for migraine. Take one (1) tablet at onset of headache; if returns or does not resolve, may repeat after 4 hours; do not exceed five (5) mg in 24 hours. 10 tablet 6  ? SUMAtriptan (IMITREX) 100 MG tablet Take 1 tablet (100 mg total) by mouth once as needed for up to 1 dose. May repeat in 2 hours if headache persists or recurs. 10 tablet 12  ? topiramate (TOPAMAX) 50 MG tablet Take 1 tablet (50 mg total) by mouth at bedtime. 30 tablet 6  ? XULANE 150-35 MCG/24HR transdermal patch 1 patch once a week.    ? ?No current facility-administered medications on file prior to visit.  ? ? ?ALLERGIES: ?Allergies  ?Allergen Reactions  ? Molds & Smuts   ? ? ?FAMILY HISTORY: ?Family History  ?Problem Relation Age of Onset  ? Irritable bowel syndrome Mother   ? Irritable bowel syndrome Father   ? Ulcers Father   ?     Stomach Ulcers  ? Migraines Paternal Grandmother   ? Migraines Paternal Aunt   ? Migraines Paternal Aunt   ? ? ?Objective:  ?Blood pressure 107/66, pulse 75, height 5\' 5"  (1.651 m), weight 173 lb 6.4 oz (78.7 kg), SpO2 97 %, unknown if currently breastfeeding. ?General: No acute distress.  Patient appears well-groomed.   ?Head:  Normocephalic/atraumatic ?Eyes:  fundi examined but not visualized ?Neurological Exam: ?Mental status: alert and oriented to person, place, and time, recent and remote memory intact, fund of knowledge intact, attention and concentration intact, speech fluent and not dysarthric, language intact. ?Cranial nerves: ?CN I: not tested ?CN II: pupils equal, round and reactive to light, visual fields intact ?CN III, IV, VI:  full range of motion, no nystagmus, no ptosis ?CN V: facial sensation  intact. ?CN VII: upper and lower face symmetric ?CN VIII: hearing intact ?CN IX, X: gag intact, uvula midline ?CN XI: sternocleidomastoid and trapezius muscles intact ?CN XII: tongue midline ?Bulk & Tone: normal, no fasciculations. ?Motor:  muscle strength 5/5 throughout ?Sensation:  Pinprick, temperature and vibratory sensation intact. ?Deep Tendon Reflexes:  2+ throughout,  toes downgoing.   ?Finger to nose testing:  Without dysmetria.   ?Heel to shin:  Without dysmetria.   ?Gait:  Normal station and stride.  Romberg negative. ? ? ? ?Thank you for allowing me to take part in the care of this patient. ? ? , DO ? ?CC: Shon Millet, MD ? ? ? ? ?

## 2022-02-20 ENCOUNTER — Encounter: Payer: Self-pay | Admitting: Neurology

## 2022-02-20 ENCOUNTER — Other Ambulatory Visit: Payer: Self-pay

## 2022-02-20 ENCOUNTER — Ambulatory Visit: Payer: Medicaid Other | Admitting: Neurology

## 2022-02-20 VITALS — BP 107/66 | HR 75 | Ht 65.0 in | Wt 173.4 lb

## 2022-02-20 DIAGNOSIS — R42 Dizziness and giddiness: Secondary | ICD-10-CM | POA: Diagnosis not present

## 2022-02-20 DIAGNOSIS — G43009 Migraine without aura, not intractable, without status migrainosus: Secondary | ICD-10-CM

## 2022-02-20 NOTE — Patient Instructions (Signed)
Take magnesium citrate 400mg  daily ?May use acetaminophen or ibuprofen.  Limit use of pain relievers to no more than 2 days out of week to prevent risk of rebound or medication-overuse headache. ?Keep headache diary ?Follow up 4-5 months. ?

## 2022-08-08 NOTE — Progress Notes (Deleted)
NEUROLOGY FOLLOW UP OFFICE NOTE  Julitza Guzman [id number]  Assessment/Plan:   Migraine without aura, without status migrainosus, not intractable    She plans on breastfeeding for another 2 1/2 to 3 months.  Until then, we will treat headaches as such: Migraine prevention:  increase magnesium to magnesium citrate 400mg  daily Migraine rescue:  acetaminophen or ibuprofen Limit use of pain relievers to no more than 2 days out of week to prevent risk of rebound or medication-overuse headache. Keep headache diary Recommend seeing a dentist about a bite guard. Follow up 4-5 months.       Subjective:  Laurie Guzman is a 31 year old female with Bipolar disorder and generalized anxiety disorder follows up for migraines.  UPDATE: Breastfeeding?  Rescue protocol:  1000mg  Tylenol with Benadryl; ibuprofen Current NSAIDS/analgesics:  Tylenol, ibuprofen Current triptans:  contraindicated (anaphylaxis) Current ergotamine:  none Current anti-emetic:  none Current muscle relaxants:  cyclobenzaprine 10mg  QHS Current Antihypertensive medications:  none Current Antidepressant medications:  none Current Anticonvulsant medications:  none Current anti-CGRP:  Emgality Current Vitamins/Herbal/Supplements:  magnesium, citrate 400mg , D Current Antihistamines/Decongestants:  none Other therapy:  none Hormone/birth control:  none   HISTORY: Onset:  28   Headache 1: Paroxysmal stabbing headache in various locations, lasting seconds   Headache 2: Severe nonthrobbing occipital pressure   Headache 3: Intensity:  severe Location:  diffuse, neck pain Quality:  throbbing Aura:  absent Prodrome:  absent Associated symptoms:  Blurred vision, dizziness, nausea, photophobia, phonophobia, aural fullness.  She denies associated unilateral numbness or weakness. Duration:  1 to 3 days Frequency:  once to twice a month (much more frequent before starting magnesium) Triggers:   unknown Relieving factors:  rest Activity:  aggravates    MRI of brain without contrast on 11/12/2020 was unremarkable.  MRV of head on 11/29/2020 was normal.       Past NSAIDS/analgesics:  none Past abortive triptans:  naratriptan 2.5mg , sumatriptan 100mg  Past abortive ergotamine:  none Past muscle relaxants:  none Past anti-emetic:  Zofran Past antihypertensive medications:  beta blockers contraindicated due to hypotension Past antidepressant medications:  nortriptyline 50mg , sertraline, paroxetine Past anticonvulsant medications:  topiramate 50mg  QHS Past anti-CGRP:  none Past vitamins/Herbal/Supplements:  none Past antihistamines/decongestants:  Claritin Other past therapies:  none   Caffeine:  no - causes anxiety. Alcohol:  occasional - every 2 weeks Smoker:  no Diet:  No soda (sometimes Sprite).  Drinks water and juice. Exercise:  not recently.  Feels tired. Depression:  No; Anxiety:  Yes.  Has history of panic attacks Other pain:  Neck pain.  She reports bilateral jaw pain.  Endorses grinding teeth or biting down.   Sleep hygiene:  Good except for getting up to breastfeed. 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   Family history of headache:  paternal aunts, maternal grandmother  PAST MEDICAL HISTORY: Past Medical History:  Diagnosis Date   ADHD    Anxiety    Bipolar 1 disorder (HCC)    Bladder spasms    Chlamydia infection 06/2008   IBS (irritable bowel syndrome)    UTI (urinary tract infection)    used to get bad UTIs    MEDICATIONS: Current Outpatient Medications on File Prior to Visit  Medication Sig Dispense Refill   cholecalciferol (VITAMIN D3) 25 MCG (1000 UNIT) tablet Take 1,000 Units by mouth daily.     Galcanezumab-gnlm (EMGALITY) 120 MG/ML SOAJ Inject 120 mg into the skin every 30 (thirty) days. (Patient not  taking: Reported on 02/20/2022) 1 mL 5   magnesium 30 MG tablet Take 30 mg by mouth 2 (two) times daily.     naratriptan  (AMERGE) 2.5 MG tablet Take 1 tablet (2.5 mg total) by mouth as needed for migraine. Take one (1) tablet at onset of headache; if returns or does not resolve, may repeat after 4 hours; do not exceed five (5) mg in 24 hours. (Patient not taking: Reported on 02/20/2022) 10 tablet 6   omeprazole (PRILOSEC) 20 MG capsule Take 20 mg by mouth every morning.     No current facility-administered medications on file prior to visit.    ALLERGIES: Allergies  Allergen Reactions   Molds & Smuts     FAMILY HISTORY: Family History  Problem Relation Age of Onset   Irritable bowel syndrome Mother    Irritable bowel syndrome Father    Ulcers Father        Stomach Ulcers   Migraines Paternal Grandmother    Migraines Paternal Aunt    Migraines Paternal Aunt       Objective:  *** General: No acute distress.  Patient appears ***-groomed.   Head:  Normocephalic/atraumatic Eyes:  Fundi examined but not visualized Neck: supple, no paraspinal tenderness, full range of motion Heart:  Regular rate and rhythm Lungs:  Clear to auscultation bilaterally Back: No paraspinal tenderness Neurological Exam: alert and oriented to person, place, and time.  Speech fluent and not dysarthric, language intact.  CN II-XII intact. Bulk and tone normal, muscle strength 5/5 throughout.  Sensation to light touch intact.  Deep tendon reflexes 2+ throughout, toes downgoing.  Finger to nose testing intact.  Gait normal, Romberg negative.   Shon Millet, DO  CC: ***

## 2022-08-10 ENCOUNTER — Encounter: Payer: Self-pay | Admitting: Neurology

## 2022-08-10 ENCOUNTER — Ambulatory Visit: Payer: Medicaid Other | Admitting: Neurology

## 2022-08-10 DIAGNOSIS — Z029 Encounter for administrative examinations, unspecified: Secondary | ICD-10-CM

## 2022-08-23 IMAGING — MR MR HEAD W/O CM
10 of 11 series · 43 of 48 positions shown · non-contrast
Comparison: Head CT 09/09/2020

CLINICAL DATA: Syncope, simple, normal neuro exam. Ear pressure.
Fourteen weeks pregnant.

EXAM:
MRI HEAD WITHOUT CONTRAST
TECHNIQUE: Multiplanar, multiecho pulse sequences of the brain and surrounding
structures were obtained without intravenous contrast.

[Series 5: DWI · axial · 3.0mm · 0.88mm/px · z∈[-70,+65]mm · 10 of 100 slices shown (1 of 4)]
[im 1/100]
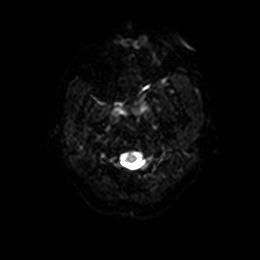
[im 12/100]
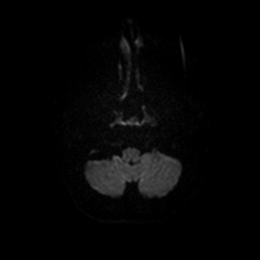
[im 23/100]
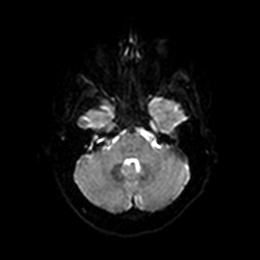
[im 34/100]
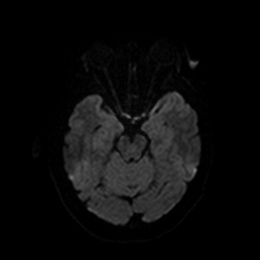
[im 45/100]
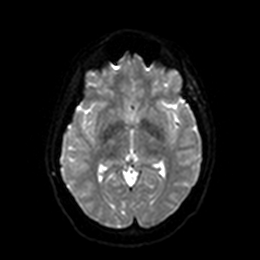
[im 56/100]
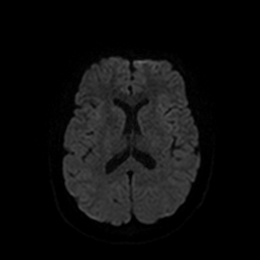
[im 67/100]
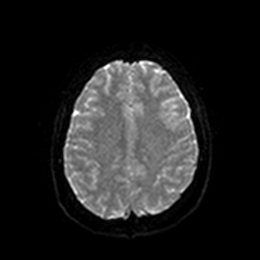
[im 78/100]
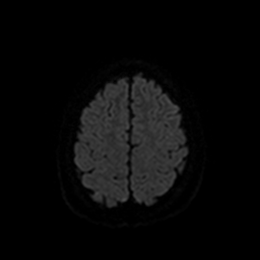
[im 89/100]
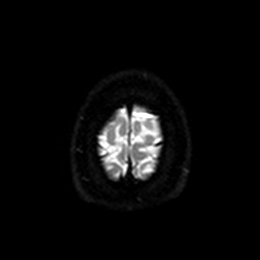
[im 100/100]
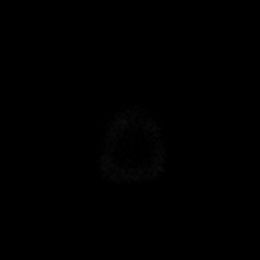

[Series 6: DWI · axial · 3.0mm · 0.88mm/px · z∈[-70,+65]mm · 5 of 50 slices shown (2 of 4)]
[im 1/50]
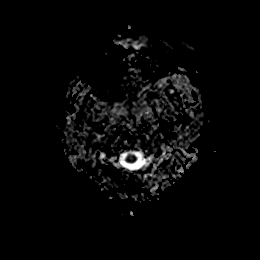
[im 13/50]
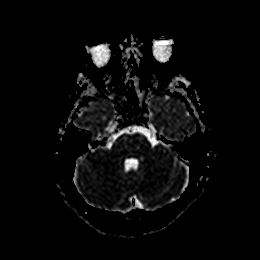
[im 25/50]
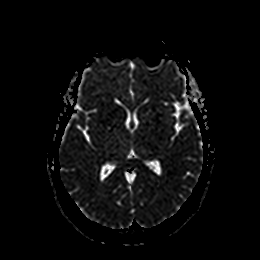
[im 37/50]
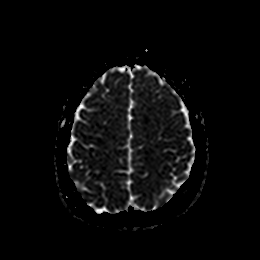
[im 50/50]
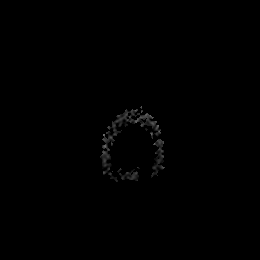

[Series 7: DWI · coronal · 4.0mm · 0.88mm/px · 6 of 68 slices shown (3 of 4)]
[im 1/68]
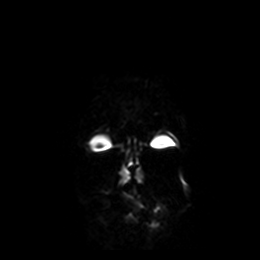
[im 14/68]
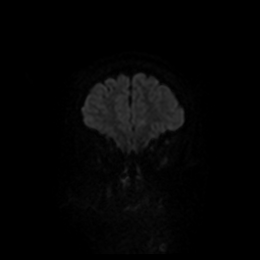
[im 27/68]
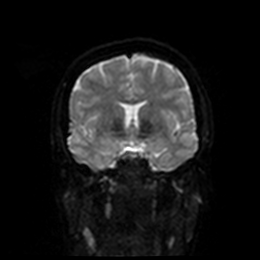
[im 41/68]
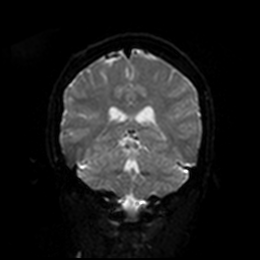
[im 54/68]
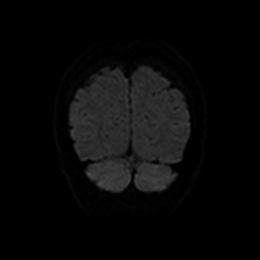
[im 68/68]
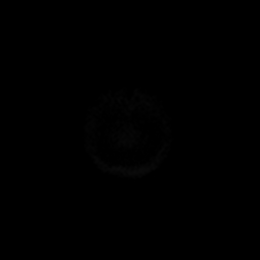

[Series 8: DWI · coronal · 4.0mm · 0.88mm/px · 3 of 34 slices shown (4 of 4)]
[im 1/34]
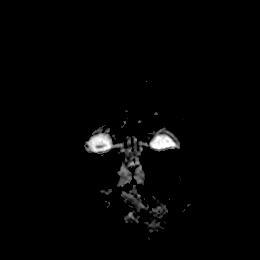
[im 17/34]
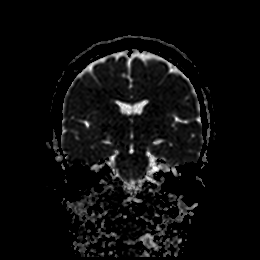
[im 34/34]
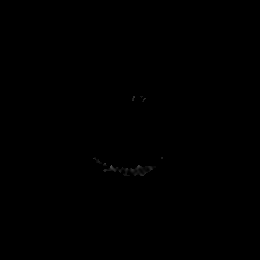

[Series 9: FLAIR · axial · 5.0mm · 0.45mm/px · z∈[-68,+64]mm · 2 of 25 slices shown]
[im 1/25]
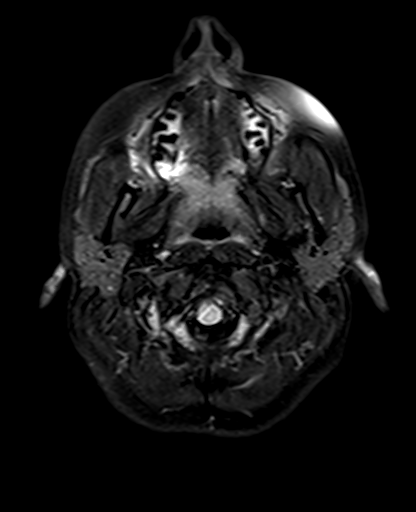
[im 25/25]
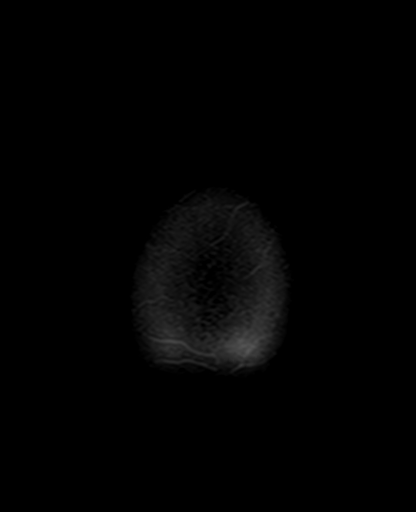

[Series 10: T1 · sagittal · 5.0mm · 0.75mm/px · 2 of 25 slices shown]
[im 1/25]
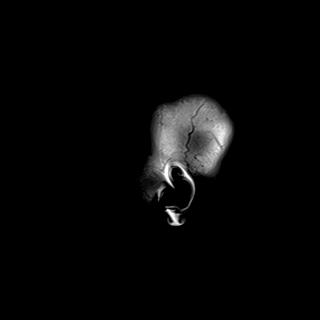
[im 25/25]
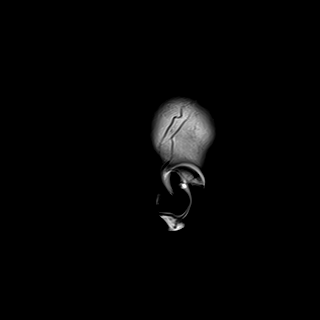

[Series 11: T2 · axial · 5.0mm · 0.72mm/px · z∈[-69,+63]mm · 2 of 25 slices shown (1 of 2)]
[im 1/25]
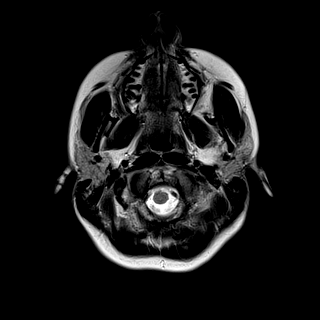
[im 25/25]
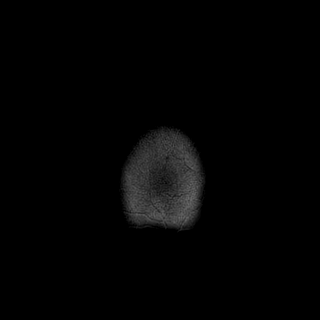

[Series 13: pha_images · axial · 3.0mm · 0.90mm/px · z∈[-69,+71]mm · 5 of 52 slices shown]
[im 1/52]
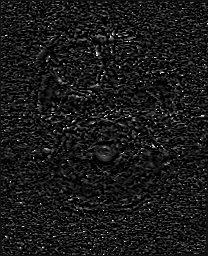
[im 13/52]
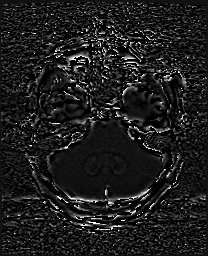
[im 26/52]
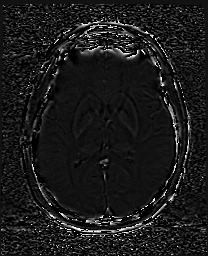
[im 39/52]
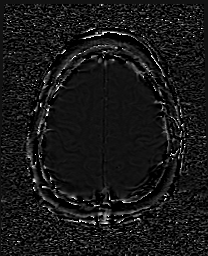
[im 52/52]
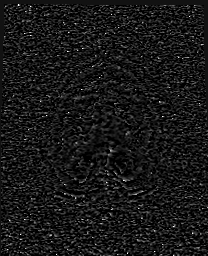

[Series 14: swi_images · axial · 3.0mm · 0.90mm/px · z∈[-69,+71]mm · 5 of 52 slices shown]
[im 1/52]
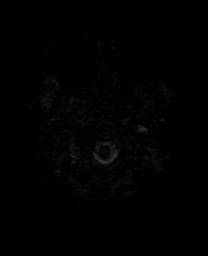
[im 13/52]
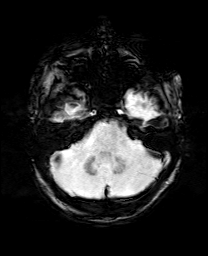
[im 26/52]
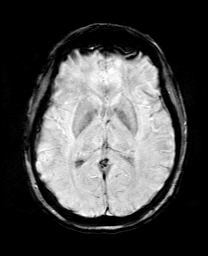
[im 39/52]
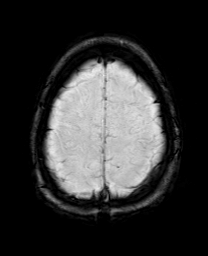
[im 52/52]
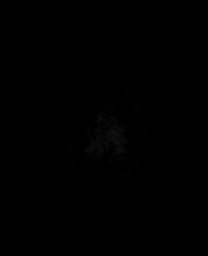

[Series 17: T2 · coronal · 5.0mm · 0.34mm/px · 3 of 30 slices shown (2 of 2)]
[im 1/30]
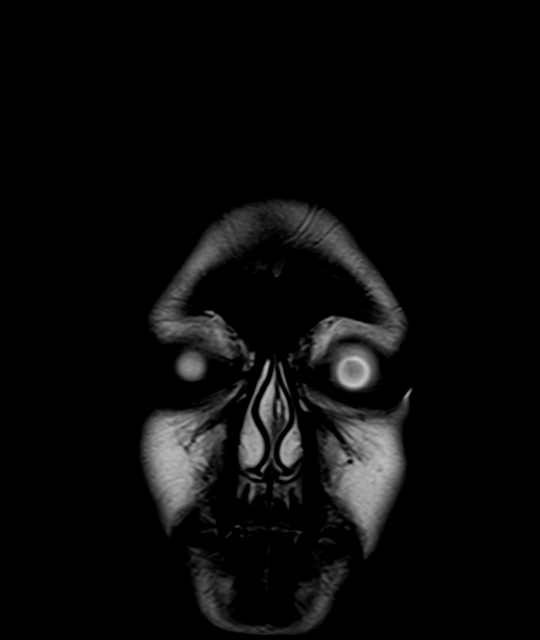
[im 15/30]
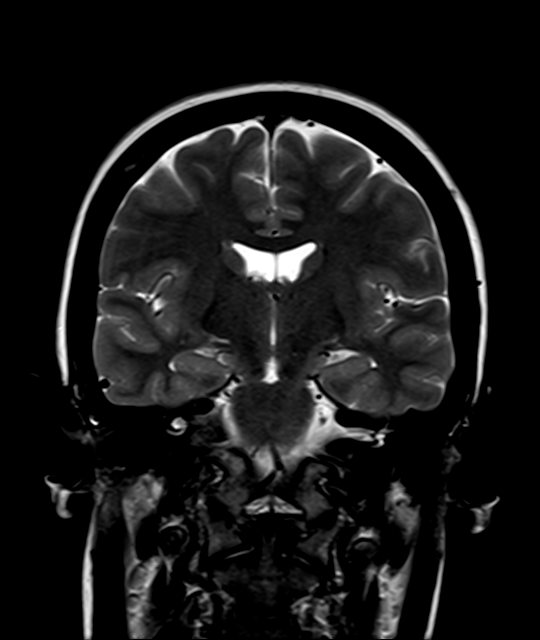
[im 30/30]
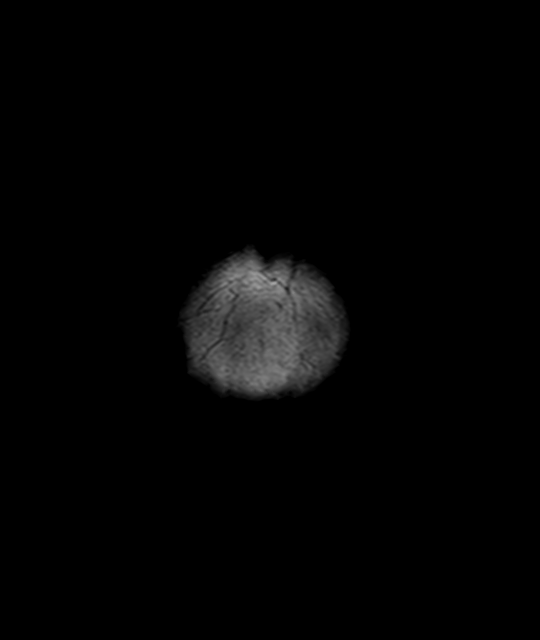

[43 of 48 positions shown; findings below may reference images not displayed]

FINDINGS: Brain:

Cerebral volume is normal.

No focal parenchymal signal abnormality is identified.

There is no acute infarct.

No evidence of intracranial mass.

No chronic intracranial blood products.

No extra-axial fluid collection.

No midline shift.

Vascular: Expected proximal arterial flow voids.

Skull and upper cervical spine: No focal marrow lesion.

Sinuses/Orbits: Visualized orbits show no acute finding. No
significant paranasal sinus disease.
IMPRESSION: Unremarkable noncontrast MRI appearance of the brain. No evidence of
acute intracranial abnormality.

## 2022-09-25 ENCOUNTER — Encounter: Payer: Self-pay | Admitting: Neurology

## 2022-09-25 ENCOUNTER — Telehealth: Payer: Medicaid Other | Admitting: Neurology

## 2022-09-25 VITALS — Ht 65.0 in | Wt 173.0 lb

## 2022-09-25 DIAGNOSIS — G43009 Migraine without aura, not intractable, without status migrainosus: Secondary | ICD-10-CM

## 2022-09-25 DIAGNOSIS — R42 Dizziness and giddiness: Secondary | ICD-10-CM | POA: Diagnosis not present

## 2022-09-25 NOTE — Progress Notes (Signed)
Virtual Visit via Video Note  Consent was obtained for video visit:  Yes.   Answered questions that patient had about telehealth interaction:  Yes.   I discussed the limitations, risks, security and privacy concerns of performing an evaluation and management service by telemedicine. I also discussed with the patient that there may be a patient responsible charge related to this service. The patient expressed understanding and agreed to proceed.  Pt location: Home Physician Location: office Name of referring provider:  Mateo Flow, MD I connected with Laurie Guzman at patients initiation/request on 09/25/2022 at  2:10 PM EDT by video enabled telemedicine application and verified that I am speaking with the correct person using two identifiers. Pt MRN:  [id number] Pt DOB:  03/01/1991 Video Participants:  Laurie Guzman   Assessment/Plan:   Migraine without aura, without status migrainosus, not intractable Episodic positional dizziness - likely peripheral vertigo     Migraine prevention:  she is on both amitriptyline and duloxetine by her PCP for anxiety and pain, which is likely helping with the headaches.  Continue management as per PCP Migraine rescue:  naproxen or ibuiprofen  Limit use of pain relievers to no more than 2 days out of week to prevent risk of rebound or medication-overuse headache. Keep headache diary Continue vestibular rehab Follow up as needed.       Subjective:  Laurie Guzman is a 31 year old female with Bipolar disorder and generalized anxiety disorder who follows up for migraines.   UPDATE: She is still breastfeeding.    She reports feeling dizzy every now and then.  Vestibular rehab helped but she had to stop for a couple of weeks due to other obligations.  She plans to restart it  She describes it as feeling like she is on a boat, not spinning.  It lasts a couple of minutes.  It tends to occur when she turns her head to the right.  The  Dix-Hallpike was reportedly negative.  Not associated with increased migraines.  Migraines occur once a month lasting a day or 2 days at the most.    In May, she developed diffuse body pain.  She went to the ED and was told it was possibly myalgia.  Started on amitriptyline by her PCP which helped.     Rescue protocol:  naproxen or ibuprofen Current NSAIDS/analgesics:  naproxen or ibuprofen  Current triptans:  contraindicated (anaphylaxis) Current ergotamine:  none Current anti-emetic:  none Current muscle relaxants:  cyclobenzaprine 10mg  QHS Current Antihypertensive medications:  none Current Antidepressant medications:  amitriptyline 25mg  at bedtime, duloxetine 30mg  daily Current Anticonvulsant medications:  none Current anti-CGRP:  none Current Vitamins/Herbal/Supplements:  magnesium citrate 400mg  daily, D Current Antihistamines/Decongestants:  none Other therapy:  none Hormone/birth control:  none  Caffeine:  no - causes anxiety. Alcohol:  occasional - every 2 weeks Smoker:  no Diet:  No soda (sometimes Sprite).  Drinks water and juice. Exercise:  not recently.  Feels tired. Depression:  No; Anxiety:  Yes.  Has history of panic attacks Other pain:  Neck pain.  She reports bilateral jaw pain.  Endorses grinding teeth or biting down.   Sleep hygiene:  Good except for getting up to breastfeed. 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    HISTORY:    Onset:  73 years old.   Headache 1: Paroxysmal stabbing headache in various locations, lasting seconds   Headache 2: Severe nonthrobbing occipital pressure   Headache 3: Intensity:  severe Location:  diffuse, neck pain Quality:  throbbing Aura:  absent Prodrome:  absent Associated symptoms:  Blurred vision, dizziness, nausea, photophobia, phonophobia, aural fullness.  She denies associated unilateral numbness or weakness. Duration:  1 to 3 days Frequency:  once to twice a month (much more frequent before  starting magnesium) Triggers:  unknown Relieving factors:  rest Activity:  aggravates    MRI of brain without contrast on 11/12/2020 was unremarkable.  MRV of head on 11/29/2020 was normal.          Past NSAIDS/analgesics:  Tylenol Past abortive triptans:  naratriptan 2.5mg , sumatriptan 100mg  Past abortive ergotamine:  none Past muscle relaxants:  none Past anti-emetic:  Zofran Past antihypertensive medications:  beta blockers contraindicated due to hypotension Past antidepressant medications:  nortriptyline 50mg , sertraline, paroxetine Past anticonvulsant medications:  topiramate 50mg  QHS Past anti-CGRP:  Emgality Past vitamins/Herbal/Supplements:  magnesium citrate 400mg  Past antihistamines/decongestants:  Claritin Other past therapies:  none    Family history of headache:  paternal aunts, maternal grandmother  Past Medical History: Past Medical History:  Diagnosis Date   ADHD    Anxiety    Bipolar 1 disorder (HCC)    Bladder spasms    Chlamydia infection 06/2008   IBS (irritable bowel syndrome)    UTI (urinary tract infection)    used to get bad UTIs    Medications: Outpatient Encounter Medications as of 09/25/2022  Medication Sig   cholecalciferol (VITAMIN D3) 25 MCG (1000 UNIT) tablet Take 1,000 Units by mouth daily.   Galcanezumab-gnlm (EMGALITY) 120 MG/ML SOAJ Inject 120 mg into the skin every 30 (thirty) days. (Patient not taking: Reported on 02/20/2022)   magnesium 30 MG tablet Take 30 mg by mouth 2 (two) times daily.   naratriptan (AMERGE) 2.5 MG tablet Take 1 tablet (2.5 mg total) by mouth as needed for migraine. Take one (1) tablet at onset of headache; if returns or does not resolve, may repeat after 4 hours; do not exceed five (5) mg in 24 hours. (Patient not taking: Reported on 02/20/2022)   omeprazole (PRILOSEC) 20 MG capsule Take 20 mg by mouth every morning.   No facility-administered encounter medications on file as of 09/25/2022.     Allergies: Allergies  Allergen Reactions   Molds & Smuts     Family History: Family History  Problem Relation Age of Onset   Irritable bowel syndrome Mother    Irritable bowel syndrome Father    Ulcers Father        Stomach Ulcers   Migraines Paternal Grandmother    Migraines Paternal Aunt    Migraines Paternal Aunt     Observations/Objective:   No acute distress.  Alert and oriented.  Speech fluent and not dysarthric.  Language intact.     Follow Up Instructions:    -I discussed the assessment and treatment plan with the patient. The patient was provided an opportunity to ask questions and all were answered. The patient agreed with the plan and demonstrated an understanding of the instructions.   The patient was advised to call back or seek an in-person evaluation if the symptoms worsen or if the condition fails to improve as anticipated.   09/27/2022, DO

## 2023-03-01 ENCOUNTER — Ambulatory Visit: Payer: Medicaid Other | Admitting: Neurology

## 2023-04-12 DIAGNOSIS — H9203 Otalgia, bilateral: Secondary | ICD-10-CM

## 2023-04-12 DIAGNOSIS — H6993 Unspecified Eustachian tube disorder, bilateral: Secondary | ICD-10-CM

## 2023-04-12 DIAGNOSIS — H9313 Tinnitus, bilateral: Secondary | ICD-10-CM | POA: Insufficient documentation

## 2023-04-12 DIAGNOSIS — H608X3 Other otitis externa, bilateral: Secondary | ICD-10-CM | POA: Insufficient documentation

## 2023-04-12 HISTORY — DX: Unspecified eustachian tube disorder, bilateral: H69.93

## 2023-04-12 HISTORY — DX: Other otitis externa, bilateral: H60.8X3

## 2023-04-12 HISTORY — DX: Otalgia, bilateral: H92.03

## 2023-04-12 HISTORY — DX: Tinnitus, bilateral: H93.13

## 2023-05-22 NOTE — Progress Notes (Unsigned)
NEUROLOGY FOLLOW UP OFFICE NOTE  Laurie Guzman [id number]  Assessment/Plan:   Migraine without aura, without status migrainosus, not intractable        Migraine prevention:  she is on both amitriptyline and duloxetine by her PCP for anxiety and pain, which is likely helping with the headaches.  Continue management as per PCP Migraine rescue:  naproxen or ibuiprofen  Limit use of pain relievers to no more than 2 days out of week to prevent risk of rebound or medication-overuse headache. Keep headache diary Continue vestibular rehab Follow up as needed.       Subjective:  Laurie Guzman is a 32 year old female with Bipolar disorder and generalized anxiety disorder who follows up for migraines.   UPDATE: She had been doing well until ***  Rescue protocol:  naproxen or ibuprofen Current NSAIDS/analgesics:  naproxen or ibuprofen  Current triptans:  contraindicated (anaphylaxis) Current ergotamine:  none Current anti-emetic:  none Current muscle relaxants:  cyclobenzaprine 10mg  QHS Current Antihypertensive medications:  none Current Antidepressant medications:  amitriptyline 25mg  at bedtime, duloxetine 30mg  daily Current Anticonvulsant medications:  none Current anti-CGRP:  none Current Vitamins/Herbal/Supplements:  magnesium citrate 400mg  daily, D Current Antihistamines/Decongestants:  none Other therapy:  none Hormone/birth control:  none   Caffeine:  no - causes anxiety. Alcohol:  occasional - every 2 weeks Smoker:  no Diet:  No soda (sometimes Sprite).  Drinks water and juice. Exercise:  not recently.  Feels tired. Depression:  No; Anxiety:  Yes.  Has history of panic attacks Other pain:  Neck pain.  She reports bilateral jaw pain.  Endorses grinding teeth or biting down.   Sleep hygiene:  Good except for getting up to breastfeed. 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     HISTORY:    Onset:  30 years old.   Headache 1: Paroxysmal  stabbing headache in various locations, lasting seconds   Headache 2: Severe nonthrobbing occipital pressure   Headache 3: Intensity:  severe Location:  diffuse, neck pain Quality:  throbbing Aura:  absent Prodrome:  absent Associated symptoms:  Blurred vision, dizziness, nausea, photophobia, phonophobia, aural fullness.  She denies associated unilateral numbness or weakness. Duration:  1 to 3 days Frequency:  once to twice a month (much more frequent before starting magnesium) Triggers:  unknown Relieving factors:  rest Activity:  aggravates     MRI of brain without contrast on 11/12/2020 was unremarkable.  MRV of head on 11/29/2020 was normal.           Past NSAIDS/analgesics:  Tylenol Past abortive triptans:  naratriptan 2.5mg , sumatriptan 100mg  Past abortive ergotamine:  none Past muscle relaxants:  none Past anti-emetic:  Zofran Past antihypertensive medications:  beta blockers contraindicated due to hypotension Past antidepressant medications:  nortriptyline 50mg , sertraline, paroxetine Past anticonvulsant medications:  topiramate 50mg  QHS Past anti-CGRP:  Emgality Past vitamins/Herbal/Supplements:  magnesium citrate 400mg  Past antihistamines/decongestants:  Claritin Other past therapies:  none     Family history of headache:  paternal aunts, maternal grandmother  PAST MEDICAL HISTORY: Past Medical History:  Diagnosis Date   ADHD    Anxiety    Bipolar 1 disorder (HCC)    Bladder spasms    Chlamydia infection 06/2008   IBS (irritable bowel syndrome)    UTI (urinary tract infection)    used to get bad UTIs    MEDICATIONS: Current Outpatient Medications on File Prior to Visit  Medication Sig Dispense Refill   amitriptyline (ELAVIL) 25 MG  tablet Take 25 mg by mouth at bedtime.     cholecalciferol (VITAMIN D3) 25 MCG (1000 UNIT) tablet Take 1,000 Units by mouth daily.     DULoxetine (CYMBALTA) 30 MG capsule Take 30 mg by mouth daily.     Galcanezumab-gnlm  (EMGALITY) 120 MG/ML SOAJ Inject 120 mg into the skin every 30 (thirty) days. (Patient not taking: Reported on 02/20/2022) 1 mL 5   magnesium 30 MG tablet Take 30 mg by mouth 2 (two) times daily.     naratriptan (AMERGE) 2.5 MG tablet Take 1 tablet (2.5 mg total) by mouth as needed for migraine. Take one (1) tablet at onset of headache; if returns or does not resolve, may repeat after 4 hours; do not exceed five (5) mg in 24 hours. (Patient not taking: Reported on 02/20/2022) 10 tablet 6   omeprazole (PRILOSEC) 20 MG capsule Take 20 mg by mouth every morning. (Patient not taking: Reported on 09/25/2022)     No current facility-administered medications on file prior to visit.    ALLERGIES: Allergies  Allergen Reactions   Molds & Smuts     FAMILY HISTORY: Family History  Problem Relation Age of Onset   Irritable bowel syndrome Mother    Irritable bowel syndrome Father    Ulcers Father        Stomach Ulcers   Migraines Paternal Grandmother    Migraines Paternal Aunt    Migraines Paternal Aunt       Objective:  *** General: No acute distress.  Patient appears ***-groomed.   Head:  Normocephalic/atraumatic Eyes:  Fundi examined but not visualized Neck: supple, no paraspinal tenderness, full range of motion Heart:  Regular rate and rhythm Lungs:  Clear to auscultation bilaterally Back: No paraspinal tenderness Neurological Exam: alert and oriented.  Speech fluent and not dysarthric, language intact.  CN II-XII intact. Bulk and tone normal, muscle strength 5/5 throughout.  Sensation to light touch intact.  Deep tendon reflexes 2+ throughout, toes downgoing.  Finger to nose testing intact.  Gait normal, Romberg negative.   Shon Millet, DO  CC: ***

## 2023-05-24 ENCOUNTER — Encounter: Payer: Self-pay | Admitting: Neurology

## 2023-05-24 ENCOUNTER — Ambulatory Visit (INDEPENDENT_AMBULATORY_CARE_PROVIDER_SITE_OTHER): Payer: Medicaid Other | Admitting: Neurology

## 2023-05-24 VITALS — BP 80/40 | HR 74 | Ht 66.0 in | Wt 179.0 lb

## 2023-05-24 DIAGNOSIS — G43009 Migraine without aura, not intractable, without status migrainosus: Secondary | ICD-10-CM

## 2023-05-24 MED ORDER — NURTEC 75 MG PO TBDP: 1.0000 | ORAL_TABLET | Freq: Every day | ORAL | 11 refills | Status: DC | PRN

## 2023-05-24 MED ORDER — ONDANSETRON 4 MG PO TBDP: 4.0000 mg | ORAL_TABLET | Freq: Three times a day (TID) | ORAL | 5 refills | Status: DC | PRN

## 2023-05-24 MED ORDER — AIMOVIG 140 MG/ML ~~LOC~~ SOAJ: 140.0000 mg | SUBCUTANEOUS | 11 refills | Status: DC

## 2023-05-24 NOTE — Patient Instructions (Signed)
Start aimovig injection every 28 days Nurtec as needed for migraine attack (1 in 24 hours) Zofran for nausea Limit use of pain relievers to no more than 2 days out of week to prevent risk of rebound or medication-overuse headache. Keep headache diary

## 2023-05-28 ENCOUNTER — Telehealth: Payer: Self-pay

## 2023-05-28 NOTE — Telephone Encounter (Signed)
Per fax received from [facility] PA needed for Nurtec.

## 2023-06-07 NOTE — Telephone Encounter (Signed)
PA has been APPROVED from 06/07/2023-06/06/2024 

## 2023-09-21 ENCOUNTER — Telehealth: Payer: Self-pay | Admitting: Neurology

## 2023-09-21 MED ORDER — PREDNISONE 10 MG (21) PO TBPK: ORAL_TABLET | ORAL | 0 refills | Status: DC

## 2023-09-21 NOTE — Telephone Encounter (Signed)
Patient advised of Dr.Jaffe note, 1. We can prescribe a prednisone taper to break this headache.  I suggest that she try the Nurtec when she gets another migraine.  Also, can we follow up on the PA for the Aimovig.  I would like her to start that as soon as possible.      Per patient her pharmacy never called once PA was approved for Nurtec. They are getting it ready for her pick up and have for next time.     PA team please start a PA for Aimovig.

## 2023-09-21 NOTE — Telephone Encounter (Signed)
How frequent or the headaches (on average, how many days a week/month are they occurring)?  Per Patient had a lot going on never started the Aimovig. Was advised she need a PA. Then had some health issues and the headaches was on the back burner but now they have come back with a vengeance.  How long do the headaches last?  Since Saturday Verify what preventative medication and dose you are taking (e.g. topiramate, propranolol, amitriptyline, Emgality, etc)  NONE Verify which rescue medication you are taking (triptan, Advil, Excedrin, Aleve, Ubrelvy, etc)  Nurtec hasn't tried it.  How often are you taking pain relievers/analgesics/rescue mediction?  Patient has tried klonopin,the and a muscle relaxant  nothing is heling. Patient c/o headache,Finger tingling, Dry eye and eye pain,peeing a lot. Throbbing/ Pulsating headache.    Patient is aware she has to have a driver if she agrees to a headache Cocktail.

## 2023-09-21 NOTE — Telephone Encounter (Signed)
Caller stated she has had migraine since last Saturday and wants to know if there is anything that can be called in for her or possibly coming in for cocktail. Stated everything she has taken is not working for the migraine.

## 2023-09-22 ENCOUNTER — Other Ambulatory Visit: Payer: Self-pay

## 2023-09-22 ENCOUNTER — Emergency Department (HOSPITAL_BASED_OUTPATIENT_CLINIC_OR_DEPARTMENT_OTHER)
Admission: EM | Admit: 2023-09-22 | Discharge: 2023-09-23 | Disposition: A | Discharge status: Home / Self Care | Payer: MEDICAID | Attending: Emergency Medicine | Admitting: Emergency Medicine

## 2023-09-22 DIAGNOSIS — G43809 Other migraine, not intractable, without status migrainosus: Secondary | ICD-10-CM | POA: Insufficient documentation

## 2023-09-22 DIAGNOSIS — R519 Headache, unspecified: Secondary | ICD-10-CM | POA: Diagnosis present

## 2023-09-22 MED ORDER — SODIUM CHLORIDE 0.9 % IV BOLUS
1000.0000 mL | Freq: Once | INTRAVENOUS | Status: AC
  Administered 2023-09-23: 1000 mL via INTRAVENOUS

## 2023-09-22 MED ORDER — METOCLOPRAMIDE HCL 5 MG/ML IJ SOLN
10.0000 mg | Freq: Once | INTRAMUSCULAR | Status: AC
  Administered 2023-09-23: 10 mg via INTRAVENOUS
  Filled 2023-09-22: qty 2

## 2023-09-22 MED ORDER — DEXAMETHASONE SODIUM PHOSPHATE 10 MG/ML IJ SOLN
10.0000 mg | Freq: Once | INTRAMUSCULAR | Status: AC
  Administered 2023-09-23: 10 mg via INTRAVENOUS
  Filled 2023-09-22: qty 1

## 2023-09-22 MED ORDER — KETOROLAC TROMETHAMINE 30 MG/ML IJ SOLN
30.0000 mg | Freq: Once | INTRAMUSCULAR | Status: AC
  Administered 2023-09-23: 30 mg via INTRAVENOUS
  Filled 2023-09-22: qty 1

## 2023-09-22 MED ORDER — DIPHENHYDRAMINE HCL 50 MG/ML IJ SOLN
25.0000 mg | Freq: Once | INTRAMUSCULAR | Status: AC
  Administered 2023-09-23: 25 mg via INTRAVENOUS
  Filled 2023-09-22: qty 1

## 2023-09-22 NOTE — ED Provider Notes (Signed)
[place] EMERGENCY DEPARTMENT AT [facility] Provider Note   CSN: [id number] Arrival date & time: 09/22/23  2029     History  Chief Complaint  Patient presents with   Migraine    Laurie Guzman is a 32 y.o. female.  Patient is a 32 year old female with past medical history of migraines, irritable bowel, bipolar, ADHD.  Patient presenting today for evaluation of headache.  She describes a persistent headache for the past 7 days.  She denies visual disturbances, nausea, fever, or stiff neck.  This feels typical of her migraines, however is not resolving with her usual home meds.  The history is provided by the patient.       Home Medications Prior to Admission medications   Medication Sig Start Date End Date Taking? Authorizing Provider  cholecalciferol (VITAMIN D3) 25 MCG (1000 UNIT) tablet Take 1,000 Units by mouth daily.    [provider]  DULoxetine (CYMBALTA) 30 MG capsule Take 30 mg by mouth daily. Patient not taking: Reported on 05/24/2023 08/24/22   [provider]  Erenumab-aooe (AIMOVIG) 140 MG/ML SOAJ Inject 140 mg into the skin every 28 (twenty-eight) days. 05/24/23   Drema Dallas, DO  famotidine (PEPCID) 20 MG tablet Take 20 mg by mouth at bedtime. 05/15/23   [provider]  Galcanezumab-gnlm (EMGALITY) 120 MG/ML SOAJ Inject 120 mg into the skin every 30 (thirty) days. Patient not taking: Reported on 02/20/2022 09/28/21   Anson Fret, MD  magnesium 30 MG tablet Take 30 mg by mouth 2 (two) times daily.    [provider]  naratriptan (AMERGE) 2.5 MG tablet Take 1 tablet (2.5 mg total) by mouth as needed for migraine. Take one (1) tablet at onset of headache; if returns or does not resolve, may repeat after 4 hours; do not exceed five (5) mg in 24 hours. Patient not taking: Reported on 02/20/2022 08/25/21   Anson Fret, MD  omeprazole (PRILOSEC) 20 MG capsule Take 20 mg by mouth every morning. Patient not taking:  Reported on 09/25/2022 01/25/22   [provider]  ondansetron (ZOFRAN-ODT) 4 MG disintegrating tablet Take 1 tablet (4 mg total) by mouth every 8 (eight) hours as needed for nausea or vomiting. 05/24/23   Everlena Cooper, Adam R, DO  pantoprazole (PROTONIX) 40 MG tablet Take 1 tablet by mouth daily. 05/14/23   [provider]  predniSONE (STERAPRED UNI-PAK 21 TAB) 10 MG (21) TBPK tablet take 60mg  day 1, then 50mg  day 2, then 40mg  day 3, then 30mg  day 4, then 20mg  day 5, then 10mg  day 6, then STOP 09/21/23   Jaffe, Adam R, DO  Rimegepant Sulfate (NURTEC) 75 MG TBDP Take 1 tablet (75 mg total) by mouth daily as needed. 05/24/23   Drema Dallas, DO      Allergies    Molds & smuts    Review of Systems   Review of Systems  All other systems reviewed and are negative.   Physical Exam Updated Vital Signs BP 120/75 (BP Location: Right Arm)   Pulse 68   Temp 98 F (36.7 C) (Oral)   Resp 18   Wt 81.2 kg   LMP 09/13/2023 (Exact Date)   SpO2 100%   BMI 28.89 kg/m  Physical Exam Vitals and nursing note reviewed.  Constitutional:      General: She is not in acute distress.    Appearance: She is well-developed. She is not diaphoretic.  HENT:     Head: Normocephalic and atraumatic.  Eyes:     Extraocular Movements: Extraocular movements intact.     Pupils: Pupils are equal, round, and reactive to light.  Cardiovascular:     Rate and Rhythm: Normal rate and regular rhythm.     Heart sounds: No murmur heard.    No friction rub. No gallop.  Pulmonary:     Effort: Pulmonary effort is normal. No respiratory distress.     Breath sounds: Normal breath sounds. No wheezing.  Abdominal:     General: Bowel sounds are normal. There is no distension.     Palpations: Abdomen is soft.     Tenderness: There is no abdominal tenderness.  Musculoskeletal:        General: Normal range of motion.     Cervical back: Normal range of motion and neck supple.  Skin:    General: Skin is warm and dry.   Neurological:     General: No focal deficit present.     Mental Status: She is alert and oriented to person, place, and time.     Cranial Nerves: No cranial nerve deficit.     Motor: No weakness.     ED Results / Procedures / Treatments   Labs (all labs ordered are listed, but only abnormal results are displayed) Labs Reviewed - No data to display  EKG None  Radiology No results found.  Procedures Procedures  {Document cardiac monitor, telemetry assessment procedure when appropriate:1}  Medications Ordered in ED Medications  sodium chloride 0.9 % bolus 1,000 mL (has no administration in time range)  ketorolac (TORADOL) 30 MG/ML injection 30 mg (has no administration in time range)  metoCLOPramide (REGLAN) injection 10 mg (has no administration in time range)  dexamethasone (DECADRON) injection 10 mg (has no administration in time range)  diphenhydrAMINE (BENADRYL) injection 25 mg (has no administration in time range)    ED Course/ Medical Decision Making/ A&P   {   Click here for ABCD2, HEART and other calculatorsREFRESH Note before signing :1}                              Medical Decision Making Risk Prescription drug management.   ***  {Document critical care time when appropriate:1} {Document review of labs and clinical decision tools ie heart score, Chads2Vasc2 etc:1}  {Document your independent review of radiology images, and any outside records:1} {Document your discussion with family members, caretakers, and with consultants:1} {Document social determinants of health affecting pt's care:1} {Document your decision making why or why not admission, treatments were needed:1} Final Clinical Impression(s) / ED Diagnoses Final diagnoses:  None    Rx / DC Orders ED Discharge Orders     None

## 2023-09-22 NOTE — ED Triage Notes (Signed)
PT to triage c/o headache x 7 days denies injury. Pt has Hx of migraines and RX meds have not been improving symptoms. PT a/o x 4 VSS NAD PT on room air.

## 2023-09-23 NOTE — Discharge Instructions (Signed)
Continue home medications as previously prescribed.  Follow-up with your neurologist if symptoms persist, and return to the ER if you develop worsening pain, high fevers, stiff neck, or for other new and concerning symptoms.

## 2023-09-24 ENCOUNTER — Other Ambulatory Visit: Payer: Self-pay

## 2023-09-24 DIAGNOSIS — N3289 Other specified disorders of bladder: Secondary | ICD-10-CM | POA: Insufficient documentation

## 2023-09-24 DIAGNOSIS — G43909 Migraine, unspecified, not intractable, without status migrainosus: Secondary | ICD-10-CM | POA: Insufficient documentation

## 2023-09-24 DIAGNOSIS — K219 Gastro-esophageal reflux disease without esophagitis: Secondary | ICD-10-CM | POA: Insufficient documentation

## 2023-09-24 DIAGNOSIS — N390 Urinary tract infection, site not specified: Secondary | ICD-10-CM | POA: Insufficient documentation

## 2023-09-24 DIAGNOSIS — Z6827 Body mass index (BMI) 27.0-27.9, adult: Secondary | ICD-10-CM | POA: Insufficient documentation

## 2023-09-24 DIAGNOSIS — F410 Panic disorder [episodic paroxysmal anxiety] without agoraphobia: Secondary | ICD-10-CM | POA: Insufficient documentation

## 2023-09-24 DIAGNOSIS — N920 Excessive and frequent menstruation with regular cycle: Secondary | ICD-10-CM | POA: Insufficient documentation

## 2023-09-24 DIAGNOSIS — K2970 Gastritis, unspecified, without bleeding: Secondary | ICD-10-CM | POA: Insufficient documentation

## 2023-09-24 DIAGNOSIS — K259 Gastric ulcer, unspecified as acute or chronic, without hemorrhage or perforation: Secondary | ICD-10-CM | POA: Insufficient documentation

## 2023-09-25 ENCOUNTER — Telehealth: Payer: Self-pay | Admitting: Neurology

## 2023-09-25 ENCOUNTER — Telehealth: Payer: Self-pay | Admitting: Pharmacy Technician

## 2023-09-25 ENCOUNTER — Other Ambulatory Visit (HOSPITAL_COMMUNITY): Payer: Self-pay

## 2023-09-25 ENCOUNTER — Ambulatory Visit: Payer: MEDICAID

## 2023-09-25 VITALS — BP 100/60 | HR 58 | Ht 65.0 in | Wt 170.0 lb

## 2023-09-25 DIAGNOSIS — R42 Dizziness and giddiness: Secondary | ICD-10-CM

## 2023-09-25 DIAGNOSIS — I441 Atrioventricular block, second degree: Secondary | ICD-10-CM | POA: Insufficient documentation

## 2023-09-25 DIAGNOSIS — R002 Palpitations: Secondary | ICD-10-CM

## 2023-09-25 HISTORY — DX: Atrioventricular block, second degree: I44.1

## 2023-09-25 HISTORY — DX: Dizziness and giddiness: R42

## 2023-09-25 HISTORY — DX: Palpitations: R00.2

## 2023-09-25 NOTE — Telephone Encounter (Signed)
Patient said she has had a migraine for 10 days. She ended up going to the ER this weekend cause she was hurting so bad. She has took two types of migraine meds and it has not helped. The cocktail from the ER did not help either. Her jaw and eyes are killing her, and her neck feel stiff. She has shocking pain through ears and she can feel her heart beating. She is super nauseous. She doesn't know what to do cause she is not getting any relief.

## 2023-09-25 NOTE — Progress Notes (Signed)
Cardiology Consultation:    Date:  09/25/2023   ID:  Laurie Guzman, DOB Aug 07, 1991, MRN [id number]  PCP:  Lise Auer, MD  Cardiologist:  Marlyn Corporal Dayten Juba, MD   Referring MD: Lise Auer, MD   No chief complaint on file. Palpitations, type I second-degree AV block noted on Zio patch monitor   ASSESSMENT AND PLAN:   Laurie Guzman 32 year old woman with history of migraines, irritable bowel syndrome, bipolar, ADHD, presenting for cardiology evaluation after recent 14-day event monitor from September 2024 showing instances of type I second-degree AV block during sleeping hours, asymptomatic as reviewed below.  Problem List Items Addressed This Visit     Palpitations    No obvious abnormalities on Zio patch for 14-day monitoring.  She did in fact feels symptoms but no significant abnormalities other than sinus heart rate and at times sinus tachycardia and rarely with isolated supraventricular and ventricular ectopic beats rarely.  Reassured her about the findings thus far. Will obtain a transthoracic echocardiogram to rule out any structural and functional abnormalities.  Will also obtain a exercise treadmill stress test to assess for any exertional arrhythmias, if these return normal she should be able to increase her activities as tolerated and participate in moderate to heavy exercise regimen.        Second degree AV block, Mobitz type I, asymptomatic during sleeping hours on Zio patch - Primary    Benign finding. No further workup needed.  Echocardiogram and stress test being done as reviewed below in the setting of palpitations.       Postural lightheadedness    Encouraged appropriate hydration, regular fluid intake at frequent intervals. Increase salt intake in food. Recommended options while changing positions suddenly.      Return to clinic based on test results.   History of Present Illness:    Laurie Guzman is a 32 y.o. female who is being  seen today for the evaluation of palpitations at the request of Lise Auer, MD.  Has history of migraines, irritable bowel syndrome, GERD, bipolar, ADHD, anxiety. Pleasant woman, here for the visit by herself.  Lives at home with her partner and 3 kids ages 54, 32 and 94.  She had a visit to the emergency room on October 12 for migraine episode which was not resolving with her usual treatment.  Symptoms are less intense but she is still reporting mild headache symptoms that are resolving.  She reports history of palpitations which have been going on for couple years.  Describes her symptoms of fast heartbeat sensation that can last for minutes to hours.  No specific triggers.  No relieving factors.  At times can feel lightheaded with these.  No syncopal episodes.  Mentions while she had the 14-day Zio patch monitor on she did feel all these symptoms and was able to activate the device appropriately.  Denies any chest pain but has noncardiac sounding chest tightness on the left side midaxillary line that is worse on laying on that side.  She also reports mild lightheadedness on changing posture, not associated with any fall or loss of consciousness.  No pedal edema, orthopnea paroxysmal nocturnal dyspnea.  No claudication symptoms.  No regular exercise at this time, previously until a year ago was able to work out 20 to 30 minutes at home. Due to ongoing symptoms she has not been exercising regularly.  She would like to start exercising if she is felt to be safe.  No snoring reported  No  blood in urine or stools.  No recent change in appetite. Mentions her last over 20 pounds in the past 3 months, had evaluation through GI services and had EGD in July 2024 without any significant abnormalities observed.  EKG in the clinic today shows sinus rhythm heart rate 58/min, PR interval 168 ms.  Normal QRS axis and duration.  No significant change in comparison to prior EKG from December 2021.  Zio  patch for 14 days from 09-03-2023 noted predominantly sinus rhythm average heart rate 64/min [ranging from 37 bpm to 159 bpm]. Rare ventricular and supraventricular ectopy. Total triggers noted 16 times, correlated with sinus rhythm ranging from heart rate 57 to 113 bpm and rarely with isolated ventricular and supraventricular ectopy. Incidences of type I second-degree AV block were observed on 09-13-2023 at 5:14 AM and 09-15-2023 at 5:11 AM, asymptomatic during sleeping hours. Reviewed the tracings and report myself.  Overall benign study.  She does not smoke. Occasional alcohol consumption, but does report more than 4 drinks on occasions.  Past Medical History:  Diagnosis Date   ADHD    Anxiety    Bipolar 1 disorder (HCC)    Bladder spasms    BMI 27.0-27.9,adult    Chlamydia infection 06/2008   Chronic eczematous otitis externa of both ears 04/12/2023   Chronic migraine without aura without status migrainosus, not intractable 08/15/2021   Dysfunction of both eustachian tubes 04/12/2023   Gastritis    GERD (gastroesophageal reflux disease)    Goiter 04/11/2021   IBS (irritable bowel syndrome)    Intermenstrual spotting    Migraine    Pre-eclampsia 08/16/2014   Severe anxiety with panic    Stomach ulcer    Tinnitus of both ears 04/12/2023   UTI (urinary tract infection)    used to get bad UTIs    Past Surgical History:  Procedure Laterality Date   WISDOM TOOTH EXTRACTION      Current Medications: Current Meds  Medication Sig   clonazePAM (KLONOPIN) 0.5 MG tablet Take 0.5 mg by mouth daily as needed for anxiety.   cyclobenzaprine (FLEXERIL) 5 MG tablet Take 5 mg by mouth as needed for muscle spasms.   Magnesium Oxide 420 MG TABS Take 420 mg by mouth daily.   naratriptan (AMERGE) 2.5 MG tablet Take 1 tablet (2.5 mg total) by mouth as needed for migraine. Take one (1) tablet at onset of headache; if returns or does not resolve, may repeat after 4 hours; do not exceed five (5)  mg in 24 hours.   ondansetron (ZOFRAN) 8 MG tablet Take 8 mg by mouth every 8 (eight) hours as needed for nausea or vomiting.   predniSONE (STERAPRED UNI-PAK 21 TAB) 10 MG (21) TBPK tablet take 60mg  day 1, then 50mg  day 2, then 40mg  day 3, then 30mg  day 4, then 20mg  day 5, then 10mg  day 6, then STOP   riboflavin (VITAMIN B-2) 100 MG TABS tablet Take 100 mg by mouth daily.   Rimegepant Sulfate (NURTEC) 75 MG TBDP Take 1 tablet (75 mg total) by mouth daily as needed.   Vitamin D, Ergocalciferol, (DRISDOL) 1.25 MG (50000 UNIT) CAPS capsule Take 50,000 Units by mouth every 7 (seven) days.     Allergies:   Molds & smuts   Social History   Socioeconomic History   Marital status: Single    Spouse name: Not on file   Number of children: 2   Years of education: Not on file   Highest education level: Some college,  no degree  Occupational History   Not on file  Tobacco Use   Smoking status: Former    Types: Cigarettes   Smokeless tobacco: Never   Tobacco comments:    Social smoker; last smoked in August 2021  Substance and Sexual Activity   Alcohol use: Not Currently    Comment: quit July 19, 2020   Drug use: Never   Sexual activity: Not on file  Other Topics Concern   Not on file  Social History Narrative   Lives at home with 2 kids and boyfriend    Left handed   Caffeine: "not really"   Social Determinants of Health   Financial Resource Strain: Not on file  Food Insecurity: Not on file  Transportation Needs: Not on file  Physical Activity: Not on file  Stress: Not on file  Social Connections: Unknown (04/23/2022)   Received from [facility]   Social Network    Social Network: Not on file     Family History: The patient's family history includes Irritable bowel syndrome in her father and mother; Migraines in her paternal aunt, paternal aunt, and paternal grandmother; Ulcers in her father.  Maternal grandmother with history of pacemaker. ROS:   Please see  the history of present illness.    All 14 point review of systems negative except as described per history of present illness.  EKGs/Labs/Other Studies Reviewed:    The following studies were reviewed today:   EKG:  EKG Interpretation Date/Time:  Tuesday September 25 2023 15:51:44 EDT Ventricular Rate:  58 PR Interval:  168 QRS Duration:  80 QT Interval:  394 QTC Calculation: 386 R Axis:   69  Text Interpretation: Normal sinus rhythm When compared with ECG of 12-Nov-2020 17:16, Vent. rate has decreased BY  35 BPM Confirmed by Huntley Dec reddy [phone]) on 09/25/2023 4:07:29 PM    Recent Labs: No results found for requested labs within last 365 days.  Recent Lipid Panel No results found for: "CHOL", "TRIG", "HDL", "CHOLHDL", "VLDL", "LDLCALC", "LDLDIRECT"  Physical Exam:    VS:  BP 100/60   Pulse (!) 58   Ht 5\' 5"  (1.651 m)   Wt 170 lb (77.1 kg)   LMP 09/13/2023 (Exact Date)   SpO2 99%   BMI 28.29 kg/m     Wt Readings from Last 3 Encounters:  09/25/23 170 lb (77.1 kg)  09/22/23 179 lb 0.2 oz (81.2 kg)  05/24/23 179 lb (81.2 kg)     GENERAL:  Well nourished, well developed in no acute distress NECK: No JVD; No carotid bruits CARDIAC: RRR, S1 and S2 present, no murmurs, no rubs, no gallops CHEST:  Clear to auscultation without rales, wheezing or rhonchi  Extremities: No pitting pedal edema. Pulses bilaterally symmetric with radial 2+ and dorsalis pedis 2+ NEUROLOGIC:  Alert and oriented x 3  Medication Adjustments/Labs and Tests Ordered: Current medicines are reviewed at length with the patient today.  Concerns regarding medicines are outlined above.  Orders Placed This Encounter  Procedures   Exercise Tolerance Test   EKG 12-Lead   ECHOCARDIOGRAM COMPLETE   No orders of the defined types were placed in this encounter.   Signed, Cecille Amsterdam, MD, MPH, [facility]. 09/25/2023 4:41 PM    [facility]

## 2023-09-25 NOTE — Assessment & Plan Note (Signed)
Encouraged appropriate hydration, regular fluid intake at frequent intervals. Increase salt intake in food. Recommended options while changing positions suddenly.

## 2023-09-25 NOTE — Assessment & Plan Note (Signed)
Benign finding. No further workup needed.  Echocardiogram and stress test being done as reviewed below in the setting of palpitations.

## 2023-09-25 NOTE — Progress Notes (Unsigned)
NEUROLOGY FOLLOW UP OFFICE NOTE  Laurie Guzman [id number]  Assessment/Plan:   Migraine without aura, with status migrainosus, intractable        Due to new status migrainosus, will check MRI of brain Migraine prevention:  Start Aimovig 140mg  every 28 days.   Migraine rescue:  Nurtec ODT 75mg  and Zofran ODT 4mg  Limit use of pain relievers to no more than 2 days out of week to prevent risk of rebound or medication-overuse headache. Keep headache diary Follow up ***       Subjective:  Laurie Guzman is a 32 year old female with Bipolar disorder and generalized anxiety disorder who follows up for migraines.   UPDATE: In June, she was prescribed Aimovig and Nurtec.  Never started ***.    She developed an intractable migraine on 10/5.  She received migraine cocktail in our office (IM Toradol 60mg Ceasar Mons 10mg /Benadryl 25mg ), a prednisone taper and a migraine cocktail in the ED (IM Toradol 30mg /Decadron 10mg Leitha Schuller 10mg /Benadryl 25mg ).  Migraine has persisted.  ***  Headaches occurring at least once a week. They are throbbing bifrontal/temporal.  Lasts from 30 minutes to all day.    Rescue protocol:  Usually tries to rest with cold compress.  Sometimes takes Tylenol Current NSAIDS/analgesics:  Tylenol Current triptans:  contraindicated (anaphylaxis) Current ergotamine:  none Current anti-emetic:  none Current muscle relaxants:  none Current Antihypertensive medications:  none Current Antidepressant medications:  none Current Anticonvulsant medications:  none Current anti-CGRP:  none Current Vitamins/Herbal/Supplements:  none Current Antihistamines/Decongestants:  none Other therapy:  none Hormone/birth control:  none   Caffeine:  no - causes anxiety. Alcohol:  occasional - every 2 weeks Smoker:  no Diet:  No soda (sometimes Sprite).  Drinks water and juice. Exercise:  not recently.  Feels tired. Depression:  No; Anxiety:  Yes.  Has history of panic attacks Other  pain:  Neck pain.  Has TMJ dysfunction. Sleep hygiene:  Good. Sometimes difficulty falling asleep.   Stay at home mom.  Has 3 children.     HISTORY:    Onset:  58 years old. Headache 1: Paroxysmal stabbing headache in various locations, lasting seconds   Headache 2: Severe nonthrobbing occipital pressure with stabbing and cold sensation   Headache 3: Intensity:  severe Location:  diffuse, neck pain Quality:  throbbing Aura:  absent Prodrome:  absent Associated symptoms:  Blurred vision, dizziness, nausea, photophobia, phonophobia, aural fullness.  She denies associated unilateral numbness or weakness. Duration:  1 to 3 days Frequency:  once to twice a month (much more frequent before starting magnesium) Triggers:  unknown Relieving factors:  rest Activity:  aggravates     MRI of brain without contrast on 11/12/2020 was unremarkable.  MRV of head on 11/29/2020 was normal.           Past NSAIDS/analgesics:  Tylenol, ibuprofen,naproxen Past abortive triptans:  naratriptan 2.5mg , sumatriptan 100mg  Past abortive ergotamine:  none Past muscle relaxants:  Flexeril Past anti-emetic:  none Past antihypertensive medications:  beta blockers contraindicated due to hypotension Past antidepressant medications:  nortriptyline 50mg , amitriptyline 25mg  at bedtime, duloxetine 30mg  daily, sertraline, paroxetine Past anticonvulsant medications:  topiramate 50mg  QHS Past anti-CGRP:  none Past vitamins/Herbal/Supplements:  magnesium citrate 400mg  Past antihistamines/decongestants:  Claritin Other past therapies:  none     Family history of headache:  paternal aunts, maternal grandmother  PAST MEDICAL HISTORY: Past Medical History:  Diagnosis Date   ADHD    Anxiety    Bipolar 1 disorder (HCC)    Bladder  spasms    BMI 27.0-27.9,adult    Chlamydia infection 06/2008   Chronic eczematous otitis externa of both ears 04/12/2023   Chronic migraine without aura without status migrainosus,  not intractable 08/15/2021   Dysfunction of both eustachian tubes 04/12/2023   Gastritis    GERD (gastroesophageal reflux disease)    Goiter 04/11/2021   IBS (irritable bowel syndrome)    Intermenstrual spotting    Migraine    Pre-eclampsia 08/16/2014   Severe anxiety with panic    Stomach ulcer    Tinnitus of both ears 04/12/2023   UTI (urinary tract infection)    used to get bad UTIs    MEDICATIONS: Current Outpatient Medications on File Prior to Visit  Medication Sig Dispense Refill   clonazePAM (KLONOPIN) 0.5 MG tablet Take 0.5 mg by mouth daily as needed for anxiety.     cyclobenzaprine (FLEXERIL) 5 MG tablet Take 5 mg by mouth as needed for muscle spasms.     Erenumab-aooe (AIMOVIG) 140 MG/ML SOAJ Inject 140 mg into the skin every 28 (twenty-eight) days. 1.12 mL 11   Galcanezumab-gnlm (EMGALITY) 120 MG/ML SOAJ Inject 120 mg into the skin every 30 (thirty) days. (Patient not taking: Reported on 02/20/2022) 1 mL 5   Lysine 500 MG TABS Take 500 mg by mouth daily.     Magnesium Oxide 420 MG TABS Take 420 mg by mouth daily.     naratriptan (AMERGE) 2.5 MG tablet Take 1 tablet (2.5 mg total) by mouth as needed for migraine. Take one (1) tablet at onset of headache; if returns or does not resolve, may repeat after 4 hours; do not exceed five (5) mg in 24 hours. (Patient not taking: Reported on 02/20/2022) 10 tablet 6   ondansetron (ZOFRAN) 8 MG tablet Take 8 mg by mouth every 8 (eight) hours as needed for nausea or vomiting.     predniSONE (STERAPRED UNI-PAK 21 TAB) 10 MG (21) TBPK tablet take 60mg  day 1, then 50mg  day 2, then 40mg  day 3, then 30mg  day 4, then 20mg  day 5, then 10mg  day 6, then STOP 21 tablet 0   riboflavin (VITAMIN B-2) 100 MG TABS tablet Take 100 mg by mouth daily.     Rimegepant Sulfate (NURTEC) 75 MG TBDP Take 1 tablet (75 mg total) by mouth daily as needed. 8 tablet 11   Vitamin D, Ergocalciferol, (DRISDOL) 1.25 MG (50000 UNIT) CAPS capsule Take 50,000 Units by mouth  every 7 (seven) days.     No current facility-administered medications on file prior to visit.    ALLERGIES: Allergies  Allergen Reactions   Molds & Smuts     FAMILY HISTORY: Family History  Problem Relation Age of Onset   Irritable bowel syndrome Mother    Irritable bowel syndrome Father    Ulcers Father        Stomach Ulcers   Migraines Paternal Grandmother    Migraines Paternal Aunt    Migraines Paternal Aunt       Objective:  *** General: No acute distress.  Patient appears well-groomed.   Head:  Normocephalic/atraumatic Eyes:  Fundi examined but not visualized Neck: supple, no paraspinal tenderness, full range of motion Heart:  Regular rate and rhythm Neurological Exam: ***   Shon Millet, DO  CC: Luna Kitchens, MD

## 2023-09-25 NOTE — Patient Instructions (Signed)
Medication Instructions:  Your physician recommends that you continue on your current medications as directed. Please refer to the Current Medication list given to you today.  *If you need a refill on your cardiac medications before your next appointment, please call your pharmacy*   Lab Work: None If you have labs (blood work) drawn today and your tests are completely normal, you will receive your results only by: MyChart Message (if you have MyChart) OR A paper copy in the mail If you have any lab test that is abnormal or we need to change your treatment, we will call you to review the results.   Testing/Procedures: Your physician has requested that you have an echocardiogram. Echocardiography is a painless test that uses sound waves to create images of your heart. It provides your doctor with information about the size and shape of your heart and how well your heart's chambers and valves are working. This procedure takes approximately one hour. There are no restrictions for this procedure. Please do NOT wear cologne, perfume, aftershave, or lotions (deodorant is allowed). Please arrive 15 minutes prior to your appointment time.  Treadmill Stress Test Instructions:    1. You may take all of your medications except (hold beta blockers).  2. No food, drink or tobacco products 2 hours prior to your test.  3. Dress prepared to exercise. Best to wear 2 piece outfit and tennis shoes. Shoes must be closed toe.  4. Please bring all current prescription medications.    Follow-Up: At [facility], you and your health needs are our priority.  As part of our continuing mission to provide you with exceptional heart care, we have created designated Provider Care Teams.  These Care Teams include your primary Cardiologist (physician) and Advanced Practice Providers (APPs -  Physician Assistants and Nurse Practitioners) who all work together to provide you with the care you need, when you  need it.  We recommend signing up for the patient portal called "MyChart".  Sign up information is provided on this After Visit Summary.  MyChart is used to connect with patients for Virtual Visits (Telemedicine).  Patients are able to view lab/test results, encounter notes, upcoming appointments, etc.  Non-urgent messages can be sent to your provider as well.   To learn more about what you can do with MyChart, go to [email or web address].    Your next appointment:   Follow up to be determined based on testing  Provider:   Huntley Dec, MD    Other Instructions None

## 2023-09-25 NOTE — Telephone Encounter (Signed)
Pharmacy Patient Advocate Encounter  Received notification from [facility] that Prior Authorization for AIMOVIG 140MG  has been APPROVED from 10.16.24 to 1.16.25   PA #/Case ID/Reference #: [id number]

## 2023-09-25 NOTE — Telephone Encounter (Signed)
Pharmacy Patient Advocate Encounter   Received notification from Physician's Office that prior authorization for AIMOVIG 140MG  is required/requested.   Insurance verification completed.   The patient is insured through [facility] .   Per test claim: PA required; PA submitted to [employer] via CoverMyMeds Key/confirmation #/[id number] Status is pending

## 2023-09-25 NOTE — Assessment & Plan Note (Signed)
No obvious abnormalities on Zio patch for 14-day monitoring.  She did in fact feels symptoms but no significant abnormalities other than sinus heart rate and at times sinus tachycardia and rarely with isolated supraventricular and ventricular ectopic beats rarely.  Reassured her about the findings thus far. Will obtain a transthoracic echocardiogram to rule out any structural and functional abnormalities.  Will also obtain a exercise treadmill stress test to assess for any exertional arrhythmias, if these return normal she should be able to increase her activities as tolerated and participate in moderate to heavy exercise regimen.

## 2023-09-26 ENCOUNTER — Encounter: Payer: Self-pay | Admitting: Neurology

## 2023-09-26 ENCOUNTER — Ambulatory Visit (INDEPENDENT_AMBULATORY_CARE_PROVIDER_SITE_OTHER): Payer: MEDICAID | Admitting: Neurology

## 2023-09-26 VITALS — BP 100/68 | HR 69 | Ht 65.5 in | Wt 170.0 lb

## 2023-09-26 DIAGNOSIS — M542 Cervicalgia: Secondary | ICD-10-CM

## 2023-09-26 DIAGNOSIS — G43011 Migraine without aura, intractable, with status migrainosus: Secondary | ICD-10-CM | POA: Diagnosis not present

## 2023-09-26 MED ORDER — BACLOFEN 10 MG PO TABS: 10.0000 mg | ORAL_TABLET | Freq: Three times a day (TID) | ORAL | 5 refills | Status: DC | PRN

## 2023-09-26 NOTE — Patient Instructions (Signed)
Check MRI of brain with and without contrast Start Aimovig 140mg  injection every 28 days To treat migraine attack, try samples and let me know which is effective (one or both): Zavzpret nasal spray 1 spray daily as needed Bernita Raisin - take one tablet as needed.  May repeat after 2 hours.  Maximum 2 tablets in 24 hours. Baclofen 10mg  three times daily as needed for neck and jaw pain Refer to physical therapy for neck pain Keep headache diary Follow up 4 months.

## 2023-09-26 NOTE — Progress Notes (Signed)
Medication Samples have been provided to the patient.  Drug name: zavzpret       Strength:         Qty: 2  LOT: [id number]  Exp.Date: 06/2024  Dosing instructions: as needed  The patient has been instructed regarding the correct time, dose, and frequency of taking this medication, including desired effects and most common side effects.   Leida Lauth 2:37 PM 09/26/2023    Medication Samples have been provided to the patient.  Drug name: Bernita Raisin       Strength: 100 mg        Qty: 4  LOT: [id number]  Exp.Date: 12/2024  Dosing instructions: as needed  The patient has been instructed regarding the correct time, dose, and frequency of taking this medication, including desired effects and most common side effects.   Leida Lauth 2:44 PM 09/26/2023

## 2023-09-27 ENCOUNTER — Ambulatory Visit: Payer: MEDICAID

## 2023-09-27 ENCOUNTER — Telehealth (HOSPITAL_COMMUNITY): Payer: Self-pay | Admitting: *Deleted

## 2023-09-27 DIAGNOSIS — I441 Atrioventricular block, second degree: Secondary | ICD-10-CM | POA: Diagnosis not present

## 2023-09-27 LAB — ECHOCARDIOGRAM COMPLETE: S' Lateral: 2.6 cm

## 2023-09-27 NOTE — Telephone Encounter (Signed)
Pt given instructions for ETT.

## 2023-10-02 ENCOUNTER — Other Ambulatory Visit: Payer: Self-pay

## 2023-10-02 ENCOUNTER — Ambulatory Visit: Payer: MEDICAID

## 2023-10-02 DIAGNOSIS — I441 Atrioventricular block, second degree: Secondary | ICD-10-CM

## 2023-10-05 LAB — EXERCISE TOLERANCE TEST
Estimated workload: 13.4
Exercise duration (min): 10 min
Exercise duration (sec): 15 s
MPHR: 189 {beats}/min
Peak HR: 162 {beats}/min
Percent HR: 85 %
RPE: 18
Rest HR: 70 {beats}/min
ST Depression (mm): 0 mm

## 2023-11-04 ENCOUNTER — Inpatient Hospital Stay
Admission: RE | Admit: 2023-11-04 | Discharge: 2023-11-04 | Disposition: A | Discharge status: Home / Self Care | Payer: MEDICAID | Source: Ambulatory Visit | Attending: Neurology

## 2023-11-04 DIAGNOSIS — M542 Cervicalgia: Secondary | ICD-10-CM

## 2023-11-04 DIAGNOSIS — G43011 Migraine without aura, intractable, with status migrainosus: Secondary | ICD-10-CM

## 2023-11-04 MED ORDER — GADOPICLENOL 0.5 MMOL/ML IV SOLN
7.5000 mL | Freq: Once | INTRAVENOUS | Status: AC | PRN
  Administered 2023-11-04: 7.5 mL via INTRAVENOUS

## 2023-11-16 ENCOUNTER — Encounter: Payer: Self-pay | Admitting: Neurology

## 2023-11-16 DIAGNOSIS — M542 Cervicalgia: Secondary | ICD-10-CM

## 2023-11-19 ENCOUNTER — Other Ambulatory Visit: Payer: Self-pay | Admitting: Neurology

## 2023-12-03 ENCOUNTER — Ambulatory Visit: Payer: Medicaid Other | Admitting: Neurology

## 2023-12-06 ENCOUNTER — Ambulatory Visit (HOSPITAL_BASED_OUTPATIENT_CLINIC_OR_DEPARTMENT_OTHER)
Admission: EM | Admit: 2023-12-06 | Discharge: 2023-12-06 | Disposition: A | Discharge status: Home / Self Care | Payer: MEDICAID | Attending: Internal Medicine | Admitting: Internal Medicine

## 2023-12-06 ENCOUNTER — Encounter (HOSPITAL_BASED_OUTPATIENT_CLINIC_OR_DEPARTMENT_OTHER): Payer: Self-pay

## 2023-12-06 DIAGNOSIS — J101 Influenza due to other identified influenza virus with other respiratory manifestations: Secondary | ICD-10-CM

## 2023-12-06 DIAGNOSIS — Z3202 Encounter for pregnancy test, result negative: Secondary | ICD-10-CM

## 2023-12-06 LAB — POC COVID19/FLU A&B COMBO
Covid Antigen, POC: NEGATIVE
Influenza A Antigen, POC: POSITIVE — AB
Influenza B Antigen, POC: NEGATIVE

## 2023-12-06 LAB — POCT URINE PREGNANCY: Preg Test, Ur: NEGATIVE

## 2023-12-06 MED ORDER — ONDANSETRON 4 MG PO TBDP
4.0000 mg | ORAL_TABLET | Freq: Once | ORAL | Status: DC
Start: 2023-12-06 — End: 2023-12-06

## 2023-12-06 MED ORDER — ONDANSETRON 4 MG PO TBDP: 4.0000 mg | ORAL_TABLET | Freq: Three times a day (TID) | ORAL | 0 refills | Status: DC | PRN

## 2023-12-06 MED ORDER — OSELTAMIVIR PHOSPHATE 75 MG PO CAPS: 75.0000 mg | ORAL_CAPSULE | Freq: Two times a day (BID) | ORAL | 0 refills | Status: DC

## 2023-12-06 MED ORDER — ONDANSETRON 4 MG PO TBDP
4.0000 mg | ORAL_TABLET | Freq: Once | ORAL | Status: AC
  Administered 2023-12-06: 4 mg via ORAL

## 2023-12-06 MED ORDER — PROMETHAZINE-DM 6.25-15 MG/5ML PO SYRP: 5.0000 mL | ORAL_SOLUTION | Freq: Every evening | ORAL | 0 refills | Status: DC | PRN

## 2023-12-06 NOTE — Discharge Instructions (Addendum)
You have the flu. Take tamiflu as prescribed. Take other medicines as needed for nausea and cough. Promethazine DM will make you sleepy so only take at bedtime.  If you develop any new or worsening symptoms or if your symptoms do not start to improve, please return here or follow-up with your primary care provider. If your symptoms are severe, please go to the emergency room.

## 2023-12-06 NOTE — ED Provider Notes (Signed)
[name]    CSN: [id number] Arrival date & time: 12/06/23  4098      History   Chief Complaint Chief Complaint  Patient presents with   Cough   Fever   Sore Throat   Generalized Body Aches    HPI Laurie Guzman is a 32 y.o. female.   Patient presents to urgent care for evaluation of cough, fever, nasal congestion, sore throat, and generalized bodyaches that started 2 days ago on December 04, 2023.  Cough is mostly dry but sometimes productive with clear sputum.  Reports sore throat is worsened by coughing and swallowing.  Body aches are currently a 9 on a scale of 0-10.  Max temp at home this morning was 103.0 but responded well to use of Tylenol and ibuprofen.  Currently afebrile.  Reports intermittent nausea without vomiting.  No diarrhea, abdominal pain, rash, chest pain, shortness of breath, or heart palpitations.  No recent known sick contacts with similar symptoms.  Never smoker, denies drug use.  Denies history of chronic respiratory problems.  Taking NyQuil, Benadryl, ibuprofen, and Tylenol with temporary relief. Of note, her menstrual cycle is supposed to start today but she is unsure of chance of pregnancy.  She is sexually active without use of contraception.   Cough Associated symptoms: fever   Fever Associated symptoms: cough   Sore Throat    Past Medical History:  Diagnosis Date   ADHD    Anxiety    Bipolar 1 disorder (HCC)    Bladder spasms    BMI 27.0-27.9,adult    Chlamydia infection 06/2008   Chronic eczematous otitis externa of both ears 04/12/2023   Chronic migraine without aura without status migrainosus, not intractable 08/15/2021   Dysfunction of both eustachian tubes 04/12/2023   Gastritis    GERD (gastroesophageal reflux disease)    Goiter 04/11/2021   IBS (irritable bowel syndrome)    Intermenstrual spotting    Migraine    Pre-eclampsia 08/16/2014   Severe anxiety with panic    Stomach ulcer    Tinnitus of both ears  04/12/2023   UTI (urinary tract infection)    used to get bad UTIs    Patient Active Problem List   Diagnosis Date Noted   Palpitations 09/25/2023   Second degree AV block, Mobitz type I, asymptomatic during sleeping hours on Zio patch 09/25/2023   Postural lightheadedness 09/25/2023   Bladder spasms    BMI 27.0-27.9,adult    Gastritis    GERD (gastroesophageal reflux disease)    Intermenstrual spotting    Migraine    Severe anxiety with panic    Stomach ulcer    UTI (urinary tract infection)    Chronic eczematous otitis externa of both ears 04/12/2023   Dysfunction of both eustachian tubes 04/12/2023   Referred ear pain, bilateral 04/12/2023   Tinnitus of both ears 04/12/2023   Chronic migraine without aura without status migrainosus, not intractable 08/15/2021   Goiter 04/11/2021   IBS (irritable bowel syndrome)    Bipolar 1 disorder (HCC)    Anxiety    ADHD    Pre-eclampsia 08/16/2014   Chlamydia infection 06/2008    Past Surgical History:  Procedure Laterality Date   WISDOM TOOTH EXTRACTION      OB History     Gravida  1   Para      Term      Preterm      AB      Living  SAB      IAB      Ectopic      Multiple      Live Births               Home Medications    Prior to Admission medications   Medication Sig Start Date End Date Taking? Authorizing Provider  ondansetron (ZOFRAN-ODT) 4 MG disintegrating tablet Take 1 tablet (4 mg total) by mouth every 8 (eight) hours as needed for nausea or vomiting. 12/06/23  Yes Carlisle Beers, FNP  oseltamivir (TAMIFLU) 75 MG capsule Take 1 capsule (75 mg total) by mouth every 12 (twelve) hours. 12/06/23  Yes Carlisle Beers, FNP  promethazine-dextromethorphan (PROMETHAZINE-DM) 6.25-15 MG/5ML syrup Take 5 mLs by mouth at bedtime as needed for cough. 12/06/23  Yes Carlisle Beers, FNP  baclofen (LIORESAL) 10 MG tablet Take 1 tablet (10 mg total) by mouth 3 (three) times daily as  needed for muscle spasms. 09/26/23   Drema Dallas, DO  clonazePAM (KLONOPIN) 0.5 MG tablet Take 0.5 mg by mouth daily as needed for anxiety. 09/03/23   [provider]  cyclobenzaprine (FLEXERIL) 5 MG tablet Take 5 mg by mouth as needed for muscle spasms.    [provider]  Erenumab-aooe (AIMOVIG) 140 MG/ML SOAJ Inject 140 mg into the skin every 28 (twenty-eight) days. Patient not taking: Reported on 09/25/2023 05/24/23   Drema Dallas, DO  Galcanezumab-gnlm (EMGALITY) 120 MG/ML SOAJ Inject 120 mg into the skin every 30 (thirty) days. Patient not taking: Reported on 02/20/2022 09/28/21   Anson Fret, MD  Lysine 500 MG TABS Take 500 mg by mouth daily. Patient not taking: Reported on 09/25/2023    [provider]  Magnesium Oxide 420 MG TABS Take 420 mg by mouth daily. Patient not taking: Reported on 09/26/2023    [provider]  naratriptan (AMERGE) 2.5 MG tablet Take 1 tablet (2.5 mg total) by mouth as needed for migraine. Take one (1) tablet at onset of headache; if returns or does not resolve, may repeat after 4 hours; do not exceed five (5) mg in 24 hours. Patient not taking: Reported on 09/26/2023 08/25/21   Anson Fret, MD  ondansetron [facility]) 8 MG tablet Take 8 mg by mouth every 8 (eight) hours as needed for nausea or vomiting. 07/30/23   [provider]  predniSONE (STERAPRED UNI-PAK 21 TAB) 10 MG (21) TBPK tablet take 60mg  day 1, then 50mg  day 2, then 40mg  day 3, then 30mg  day 4, then 20mg  day 5, then 10mg  day 6, then STOP Patient not taking: Reported on 09/26/2023 09/21/23   Drema Dallas, DO  riboflavin (VITAMIN B-2) 100 MG TABS tablet Take 100 mg by mouth daily. Patient not taking: Reported on 09/26/2023    [provider]  Rimegepant Sulfate (NURTEC) 75 MG TBDP Take 1 tablet (75 mg total) by mouth daily as needed. Patient not taking: Reported on 09/26/2023 05/24/23   Drema Dallas, DO  Vitamin D, Ergocalciferol, (DRISDOL)  1.25 MG (50000 UNIT) CAPS capsule Take 50,000 Units by mouth every 7 (seven) days. Patient not taking: Reported on 09/26/2023    [provider]    Family History Family History  Problem Relation Age of Onset   Irritable bowel syndrome Mother    Irritable bowel syndrome Father    Ulcers Father        Stomach Ulcers   Migraines Paternal Grandmother    Migraines Paternal Aunt  Migraines Paternal Aunt     Social History Social History   Tobacco Use   Smoking status: Former    Types: Cigarettes   Smokeless tobacco: Never   Tobacco comments:    Social smoker; last smoked in August 2021  Vaping Use   Vaping status: Never Used  Substance Use Topics   Alcohol use: Not Currently    Comment: quit July 19, 2020   Drug use: Never     Allergies   Molds & smuts   Review of Systems Review of Systems  Constitutional:  Positive for fever.  Respiratory:  Positive for cough.   Per HPI   Physical Exam Triage Vital Signs ED Triage Vitals  Encounter Vitals Group     BP 12/06/23 0855 103/73     Systolic BP Percentile --      Diastolic BP Percentile --      Pulse Rate 12/06/23 0855 97     Resp 12/06/23 0855 20     Temp 12/06/23 0855 98.5 F (36.9 C)     Temp Source 12/06/23 0855 Oral     SpO2 12/06/23 0855 98 %     Weight 12/06/23 0856 173 lb (78.5 kg)     Height --      Head Circumference --      Peak Flow --      Pain Score 12/06/23 0856 9     Pain Loc --      Pain Education --      Exclude from Growth Chart --    No data found.  Updated Vital Signs BP 103/73   Pulse 97   Temp 98.5 F (36.9 C) (Oral)   Resp 20   Wt 173 lb (78.5 kg)   LMP 11/06/2023 (Approximate)   SpO2 98%   BMI 28.79 kg/m   Visual Acuity Right Eye Distance:   Left Eye Distance:   Bilateral Distance:    Right Eye Near:   Left Eye Near:    Bilateral Near:     Physical Exam Vitals and nursing note reviewed.  Constitutional:      Appearance: She is ill-appearing. She is  not toxic-appearing.  HENT:     Head: Normocephalic and atraumatic.     Right Ear: Hearing, tympanic membrane, ear canal and external ear normal.     Left Ear: Hearing, tympanic membrane, ear canal and external ear normal.     Nose: Congestion present.     Mouth/Throat:     Lips: Pink.     Mouth: Mucous membranes are moist. No injury.     Tongue: No lesions. Tongue does not deviate from midline.     Palate: No mass and lesions.     Pharynx: Oropharynx is clear. Uvula midline. Posterior oropharyngeal erythema present. No pharyngeal swelling, oropharyngeal exudate or uvula swelling.     Tonsils: No tonsillar exudate or tonsillar abscesses. 1+ on the right. 1+ on the left.  Eyes:     General: Lids are normal. Vision grossly intact. Gaze aligned appropriately.     Extraocular Movements: Extraocular movements intact.     Conjunctiva/sclera: Conjunctivae normal.  Cardiovascular:     Rate and Rhythm: Normal rate and regular rhythm.     Heart sounds: Normal heart sounds, S1 normal and S2 normal.  Pulmonary:     Effort: Pulmonary effort is normal. No respiratory distress.     Breath sounds: Normal breath sounds and air entry. No wheezing, rhonchi or rales.  Chest:  Chest wall: No tenderness.  Musculoskeletal:     Cervical back: Neck supple.  Skin:    General: Skin is warm and dry.     Capillary Refill: Capillary refill takes less than 2 seconds.     Findings: No rash.  Neurological:     General: No focal deficit present.     Mental Status: She is alert and oriented to person, place, and time. Mental status is at baseline.     Cranial Nerves: No dysarthria or facial asymmetry.  Psychiatric:        Mood and Affect: Mood normal.        Speech: Speech normal.        Behavior: Behavior normal.        Thought Content: Thought content normal.        Judgment: Judgment normal.      UC Treatments / Results  Labs (all labs ordered are listed, but only abnormal results are  displayed) Labs Reviewed  POC COVID19/FLU A&B COMBO - Abnormal; Notable for the following components:      Result Value   Influenza A Antigen, POC Positive (*)    All other components within normal limits  POCT URINE PREGNANCY - Normal    EKG   Radiology No results found.  Procedures Procedures (including critical care time)  Medications Ordered in UC Medications  ondansetron (ZOFRAN-ODT) disintegrating tablet 4 mg (4 mg Oral Given 12/06/23 0923)    Initial Impression / Assessment and Plan / UC Course  I have reviewed the triage vital signs and the nursing notes.  Pertinent labs & imaging results that were available during my care of the patient were reviewed by me and considered in my medical decision making (see chart for details).   1.  Influenza A, urine pregnancy test negative Flu A point-of-care testing positive, Tamiflu sent.  Supportive care recommended. Deferred imaging based on stable cardiopulmonary exam and hemodynamically stable vital signs. Zofran given in clinic for acute nausea. She is overall nontoxic in appearance.  Work note given.  Counseled patient on potential for adverse effects with medications prescribed/recommended today, strict ER and return-to-clinic precautions discussed, patient verbalized understanding.    Final Clinical Impressions(s) / UC Diagnoses   Final diagnoses:  Influenza A  Urine pregnancy test negative     Discharge Instructions      You have the flu. Take tamiflu as prescribed. Take other medicines as needed for nausea and cough. Promethazine DM will make you sleepy so only take at bedtime.  If you develop any new or worsening symptoms or if your symptoms do not start to improve, please return here or follow-up with your primary care provider. If your symptoms are severe, please go to the emergency room.     ED Prescriptions     Medication Sig Dispense Auth. Provider   ondansetron (ZOFRAN-ODT) 4 MG disintegrating  tablet Take 1 tablet (4 mg total) by mouth every 8 (eight) hours as needed for nausea or vomiting. 20 tablet Carlisle Beers, FNP   oseltamivir (TAMIFLU) 75 MG capsule Take 1 capsule (75 mg total) by mouth every 12 (twelve) hours. 10 capsule Carlisle Beers, FNP   promethazine-dextromethorphan (PROMETHAZINE-DM) 6.25-15 MG/5ML syrup Take 5 mLs by mouth at bedtime as needed for cough. 118 mL Carlisle Beers, FNP      PDMP not reviewed this encounter.   Carlisle Beers, [place] 12/06/23 [phone]

## 2023-12-06 NOTE — ED Triage Notes (Signed)
Onset Christmas Eve. Patient tearful due to discomfort. States feels as if she is swallowing glass. Also reports cough with thick mucous.

## 2023-12-30 ENCOUNTER — Ambulatory Visit
Admission: RE | Admit: 2023-12-30 | Discharge: 2023-12-30 | Disposition: A | Discharge status: Home / Self Care | Payer: MEDICAID | Source: Ambulatory Visit | Attending: Neurology | Admitting: Neurology

## 2023-12-30 DIAGNOSIS — M542 Cervicalgia: Secondary | ICD-10-CM

## 2023-12-30 MED ORDER — GADOPICLENOL 0.5 MMOL/ML IV SOLN
7.5000 mL | Freq: Once | INTRAVENOUS | Status: AC | PRN
  Administered 2023-12-30: 7.5 mL via INTRAVENOUS

## 2024-01-04 ENCOUNTER — Ambulatory Visit: Payer: MEDICAID

## 2024-01-04 VITALS — BP 100/70 | HR 53 | Ht 65.6 in | Wt 171.0 lb

## 2024-01-04 DIAGNOSIS — R002 Palpitations: Secondary | ICD-10-CM

## 2024-01-04 MED ORDER — METOPROLOL TARTRATE 25 MG PO TABS: 12.5000 mg | ORAL_TABLET | ORAL | 0 refills | Status: DC | PRN

## 2024-01-04 NOTE — Patient Instructions (Signed)
Medication Instructions:    You may take Metoprolol 12.5 mg as needed for palpations or high heart rate.   *If you need a refill on your cardiac medications before your next appointment, please call your pharmacy*   Lab Work: None Ordered If you have labs (blood work) drawn today and your tests are completely normal, you will receive your results only by: MyChart Message (if you have MyChart) OR A paper copy in the mail If you have any lab test that is abnormal or we need to change your treatment, we will call you to review the results.   Testing/Procedures: None Ordered   Follow-Up: At [facility], you and your health needs are our priority.  As part of our continuing mission to provide you with exceptional heart care, we have created designated Provider Care Teams.  These Care Teams include your primary Cardiologist (physician) and Advanced Practice Providers (APPs -  Physician Assistants and Nurse Practitioners) who all work together to provide you with the care you need, when you need it.  We recommend signing up for the patient portal called "MyChart".  Sign up information is provided on this After Visit Summary.  MyChart is used to connect with patients for Virtual Visits (Telemedicine).  Patients are able to view lab/test results, encounter notes, upcoming appointments, etc.  Non-urgent messages can be sent to your provider as well.   To learn more about what you can do with MyChart, go to [email or web address].

## 2024-01-04 NOTE — Assessment & Plan Note (Signed)
No major abnormalities on workup thus far as reviewed below. Reassured her about the findings. Her symptoms appear to be more driven by her baseline slow heart rate at rest with a sense of powerful heartbeat sensation which drives her anxiety.  Advised her to follow-up closely with her PCP and have her anxiety management optimized.  Empirically for times where she feels her heart rate is elevated, once she confirms that her heart rate is in fact elevated, she can take a small dose of metoprolol tartrate 12.5 mg once daily.  I will send out a prescription for 30-day supply.  Advised her not to use those on routine basis if her heart rates are slower than 60 bpm

## 2024-01-04 NOTE — Progress Notes (Signed)
Cardiology Consultation:    Date:  01/04/2024   ID:  Laurie Guzman, DOB [birth year], MRN [id number]  PCP:  Lise Auer, MD  Cardiologist:  Marlyn Corporal Kayceon Oki, MD   Referring MD: Lise Auer, MD   No chief complaint on file.    ASSESSMENT AND PLAN:   Laurie Guzman 33 year old woman with history of migraines, irritable bowel syndrome, bipolar, ADD, chronic palpitations, with no significant abnormalities on workup with echocardiogram and treadmill exercise stress test in October 2024.  Event monitor for 14 days in September noted average heart rate 64/min with rare ectopic burden and had transient type I second-degree AV block during sleeping hours that was asymptomatic.  Here for follow-up visit due to significant symptoms of palpitations as reviewed below which appear more related to slow baseline heart rate with sense of anxiety that causes panic attacks.  Problem List Items Addressed This Visit     Palpitations - Primary   No major abnormalities on workup thus far as reviewed below. Reassured her about the findings. Her symptoms appear to be more driven by her baseline slow heart rate at rest with a sense of powerful heartbeat sensation which drives her anxiety.  Advised her to follow-up closely with her PCP and have her anxiety management optimized.  Empirically for times where she feels her heart rate is elevated, once she confirms that her heart rate is in fact elevated, she can take a small dose of metoprolol tartrate 12.5 mg once daily.  I will send out a prescription for 30-day supply.  Advised her not to use those on routine basis if her heart rates are slower than 60 bpm      Relevant Orders   EKG 12-Lead   Return to clinic for follow-up with [place] as needed.   History of Present Illness:    Laurie Guzman is a 33 y.o. female who is being seen today for follow-up visit. Prior visit with [place] in the office was 09/25/2023. PCP is Lise Auer, MD.   History  of migraines, irritable bowel syndrome, bipolar, ADHD, chronic palpitations, intermittent type I second-degree AV block on recent event monitor mostly during sleeping hours [Zio patch 14 days 09-03-2023].  Non-smoker and rare alcohol consumption on social occasions [now quit since January 2025]  Workup done for palpitations with treadmill Stress test October 2024 shows good exercise tolerance, good chronotropic response, no ischemia. Cardiac structure and function assessment with echocardiogram noted normal biventricular function LVEF 60 to 65%, normal diastolic function, no significant valve abnormalities.  Lives at home with her partner and 3 kids.  [Ages 9, 4, 2].  Here for the visit today accompanied by her 57-year-old daughter.  Mentions she has been extremely stressed out about frequent sensation of palpitations which she describes as awareness of heartbeat typically at night when she lays down to sleep and feels like heart is pounding and this increases her anxiety and she panics and which in turn increases her heart rate.  Denies any significant functional limitations.  Has been stressed at home, mentions significant effort having to take care of 3 kids and feels there is not enough support from her partner with home day-to-day activities as he is busy with work and out of the house.  She feels Klonopin that was recently started has helped her with anxiety.  She has given up drinking alcohol entirely this January.  She had a recent visit to the ER on 12-06-2023 for symptoms and noted to have influenza  A.  Symptoms have resolved  EKG in the clinic showed sinus rhythm heart rate 53/min, PR interval 200 ms, QRS duration 84 ms, QTc 412 ms.  Zio patch for 14 days from 09-03-2023 noted predominantly sinus rhythm average heart rate 64/min [ranging from 37 bpm to 159 bpm]. Rare ventricular and supraventricular ectopy. Total triggers noted 16 times, correlated with sinus rhythm ranging from heart  rate 57 to 113 bpm and rarely with isolated ventricular and supraventricular ectopy. Incidences of type I second-degree AV block were observed on 09-13-2023 at 5:14 AM and 09-15-2023 at 5:11 AM, asymptomatic during sleeping hours. Reviewed the tracings and report myself.  Overall benign study.  Past Medical History:  Diagnosis Date   ADHD    Anxiety    Bipolar 1 disorder (HCC)    Bladder spasms    BMI 27.0-27.9,adult    Chlamydia infection 06/2008   Chronic eczematous otitis externa of both ears 04/12/2023   Chronic migraine without aura without status migrainosus, not intractable 08/15/2021   Dysfunction of both eustachian tubes 04/12/2023   Gastritis    GERD (gastroesophageal reflux disease)    Goiter 04/11/2021   IBS (irritable bowel syndrome)    Intermenstrual spotting    Migraine    Palpitations 09/25/2023   Postural lightheadedness 09/25/2023   Pre-eclampsia 08/16/2014   Referred ear pain, bilateral 04/12/2023   Second degree AV block, Mobitz type I, asymptomatic during sleeping hours on Zio patch 09/25/2023   Severe anxiety with panic    Stomach ulcer    Tinnitus of both ears 04/12/2023   UTI (urinary tract infection)    used to get bad UTIs    Past Surgical History:  Procedure Laterality Date   WISDOM TOOTH EXTRACTION      Current Medications: Current Meds  Medication Sig   clonazePAM (KLONOPIN) 0.5 MG tablet Take 0.5 mg by mouth daily as needed for anxiety.   metoprolol tartrate (LOPRESSOR) 25 MG tablet Take 0.5 tablets (12.5 mg total) by mouth as needed (as needed for palpations or high rate).     Allergies:   Molds & smuts   Social History   Socioeconomic History   Marital status: Single    Spouse name: Not on file   Number of children: 2   Years of education: Not on file   Highest education level: Some college, no degree  Occupational History   Not on file  Tobacco Use   Smoking status: Former    Types: Cigarettes   Smokeless tobacco: Never    Tobacco comments:    Social smoker; last smoked in August 2021  Vaping Use   Vaping status: Never Used  Substance and Sexual Activity   Alcohol use: Not Currently    Comment: quit July 19, 2020   Drug use: Never   Sexual activity: Not on file  Other Topics Concern   Not on file  Social History Narrative   Lives at home with 2 kids and boyfriend    Left handed   Caffeine: "not really"   Social Drivers of Corporate investment banker Strain: Not on file  Food Insecurity: Not on file  Transportation Needs: Not on file  Physical Activity: Not on file  Stress: Not on file  Social Connections: Unknown (04/23/2022)   Received from [facility]   Social Network    Social Network: Not on file     Family History: The patient's family history includes Irritable bowel syndrome in her father and mother;  Migraines in her paternal aunt, paternal aunt, and paternal grandmother; Ulcers in her father. ROS:   Please see the history of present illness.    All 14 point review of systems negative except as described per history of present illness.  EKGs/Labs/Other Studies Reviewed:    The following studies were reviewed today:   EKG:       Recent Labs: No results found for requested labs within last 365 days.  Recent Lipid Panel No results found for: "CHOL", "TRIG", "HDL", "CHOLHDL", "VLDL", "LDLCALC", "LDLDIRECT"  Physical Exam:    VS:  BP 100/70   Pulse (!) 53   Ht 5' 5.6" (1.666 m)   Wt 171 lb (77.6 kg)   LMP 11/06/2023 (Approximate)   SpO2 99%   BMI 27.94 kg/m     Wt Readings from Last 3 Encounters:  01/04/24 171 lb (77.6 kg)  12/06/23 173 lb (78.5 kg)  10/02/23 170 lb (77.1 kg)     GENERAL:  Well nourished, well developed in no acute distress CARDIAC: RRR, S1 and S2 present, no murmurs, no rubs, no gallops NEUROLOGIC:  Alert and oriented x 3.  She has been extremely emotional today computers few times talking about her ongoing symptoms and anxiety at  home.  Medication Adjustments/Labs and Tests Ordered: Current medicines are reviewed at length with the patient today.  Concerns regarding medicines are outlined above.  Orders Placed This Encounter  Procedures   EKG 12-Lead   Meds ordered this encounter  Medications   metoprolol tartrate (LOPRESSOR) 25 MG tablet    Sig: Take 0.5 tablets (12.5 mg total) by mouth as needed (as needed for palpations or high rate).    Dispense:  30 tablet    Refill:  0    Signed, Trinady Milewski reddy Lyndsey Demos, MD, MPH, [facility]. 01/04/2024 3:24 PM    [facility]

## 2024-01-07 ENCOUNTER — Telehealth: Payer: Self-pay

## 2024-01-07 MED ORDER — METOPROLOL TARTRATE 25 MG PO TABS: 12.5000 mg | ORAL_TABLET | Freq: Two times a day (BID) | ORAL | 0 refills | Status: DC | PRN

## 2024-01-07 NOTE — Telephone Encounter (Signed)
Updated prescription per Dr. Madireddy's recommendations.

## 2024-01-07 NOTE — Telephone Encounter (Signed)
Received fax wanting frequency clarification for patient's Metoprolol Tartrate 25 mg- Take 12.5 tablet by mouth as needed for palpitations or high rate.

## 2024-01-23 ENCOUNTER — Ambulatory Visit (HOSPITAL_BASED_OUTPATIENT_CLINIC_OR_DEPARTMENT_OTHER)
Admission: EM | Admit: 2024-01-23 | Discharge: 2024-01-23 | Disposition: A | Discharge status: Home / Self Care | Payer: MEDICAID | Attending: Family Medicine | Admitting: Family Medicine

## 2024-01-23 ENCOUNTER — Encounter (HOSPITAL_BASED_OUTPATIENT_CLINIC_OR_DEPARTMENT_OTHER): Payer: Self-pay | Admitting: Emergency Medicine

## 2024-01-23 ENCOUNTER — Ambulatory Visit (INDEPENDENT_AMBULATORY_CARE_PROVIDER_SITE_OTHER)
Admit: 2024-01-23 | Discharge: 2024-01-23 | Disposition: A | Discharge status: Home / Self Care | Payer: MEDICAID | Attending: Internal Medicine | Admitting: Internal Medicine

## 2024-01-23 DIAGNOSIS — R051 Acute cough: Secondary | ICD-10-CM | POA: Diagnosis not present

## 2024-01-23 DIAGNOSIS — J208 Acute bronchitis due to other specified organisms: Secondary | ICD-10-CM | POA: Diagnosis not present

## 2024-01-23 MED ORDER — ALBUTEROL SULFATE HFA 108 (90 BASE) MCG/ACT IN AERS: 2.0000 | INHALATION_SPRAY | RESPIRATORY_TRACT | 0 refills | Status: DC | PRN

## 2024-01-23 MED ORDER — COMPACT SPACE CHAMBER DEVI: 0 refills | Status: DC

## 2024-01-23 MED ORDER — PREDNISONE 20 MG PO TABS
20.0000 mg | ORAL_TABLET | Freq: Every day | ORAL | 0 refills | Status: AC
Start: 2024-01-23 — End: 2024-01-28

## 2024-01-23 NOTE — ED Provider Notes (Signed)
[name]    CSN: [id number] Arrival date & time: 01/23/24  1704      History   Chief Complaint Chief Complaint  Patient presents with   Cough   chest congestion    HPI Laurie Guzman is a 33 y.o. female.   Patient was seen on 12/06/2023 here at this urgent care for influenza type A.  She was treated and generally got better.  But she has had a persistent cough that has not gone away.  She has times especially when she is laying down and she feels like she cannot catch her breath.  She feels like she wheezes sometimes.  When she is having trouble breathing, it makes her feel like she is going to have a panic attack.  She has a runny nose at times and some chest congestion.   Cough Associated symptoms: rhinorrhea and shortness of breath   Associated symptoms: no chest pain, no chills, no ear pain, no fever, no rash and no sore throat     Past Medical History:  Diagnosis Date   ADHD    Anxiety    Bipolar 1 disorder (HCC)    Bladder spasms    BMI 27.0-27.9,adult    Chlamydia infection 06/2008   Chronic eczematous otitis externa of both ears 04/12/2023   Chronic migraine without aura without status migrainosus, not intractable 08/15/2021   Dysfunction of both eustachian tubes 04/12/2023   Gastritis    GERD (gastroesophageal reflux disease)    Goiter 04/11/2021   IBS (irritable bowel syndrome)    Intermenstrual spotting    Migraine    Palpitations 09/25/2023   Postural lightheadedness 09/25/2023   Pre-eclampsia 08/16/2014   Referred ear pain, bilateral 04/12/2023   Second degree AV block, Mobitz type I, asymptomatic during sleeping hours on Zio patch 09/25/2023   Severe anxiety with panic    Stomach ulcer    Tinnitus of both ears 04/12/2023   UTI (urinary tract infection)    used to get bad UTIs    Patient Active Problem List   Diagnosis Date Noted   Palpitations 09/25/2023   Second degree AV block, Mobitz type I, asymptomatic during sleeping  hours on Zio patch 09/25/2023   Postural lightheadedness 09/25/2023   Bladder spasms    BMI 27.0-27.9,adult    Gastritis    GERD (gastroesophageal reflux disease)    Intermenstrual spotting    Migraine    Severe anxiety with panic    Stomach ulcer    UTI (urinary tract infection)    Chronic eczematous otitis externa of both ears 04/12/2023   Dysfunction of both eustachian tubes 04/12/2023   Referred ear pain, bilateral 04/12/2023   Tinnitus of both ears 04/12/2023   Chronic migraine without aura without status migrainosus, not intractable 08/15/2021   Goiter 04/11/2021   IBS (irritable bowel syndrome)    Bipolar 1 disorder (HCC)    Anxiety    ADHD    Pre-eclampsia 08/16/2014   Chlamydia infection 06/2008    Past Surgical History:  Procedure Laterality Date   WISDOM TOOTH EXTRACTION      OB History     Gravida  1   Para      Term      Preterm      AB      Living         SAB      IAB      Ectopic      Multiple  Live Births               Home Medications    Prior to Admission medications   Medication Sig Start Date End Date Taking? Authorizing Provider  albuterol (VENTOLIN HFA) 108 (90 Base) MCG/ACT inhaler Inhale 2 puffs into the lungs every 4 (four) hours as needed for wheezing or shortness of breath. 01/23/24  Yes Prescilla Sours, FNP  baclofen (LIORESAL) 10 MG tablet Take 1 tablet (10 mg total) by mouth 3 (three) times daily as needed for muscle spasms. 09/26/23  Yes Jaffe, Adam R, DO  clonazePAM (KLONOPIN) 0.5 MG tablet Take 0.5 mg by mouth daily as needed for anxiety. 09/03/23  Yes [provider]  predniSONE (DELTASONE) 20 MG tablet Take 1 tablet (20 mg total) by mouth daily with breakfast for 5 days. 01/23/24 01/28/24 Yes Prescilla Sours, FNP  Spacer/Aero-Holding Chambers (COMPACT SPACE CHAMBER) DEVI Use with the albuterol inhaler 01/23/24  Yes Prescilla Sours, FNP  metoprolol tartrate (LOPRESSOR) 25 MG tablet Take 0.5 tablets (12.5 mg  total) by mouth 2 (two) times daily as needed (as needed for palpations or high rate). 01/07/24   Madireddy, Marlyn Corporal, MD    Family History Family History  Problem Relation Age of Onset   Irritable bowel syndrome Mother    Irritable bowel syndrome Father    Ulcers Father        Stomach Ulcers   Migraines Paternal Grandmother    Migraines Paternal Aunt    Migraines Paternal Aunt     Social History Social History   Tobacco Use   Smoking status: Former    Types: Cigarettes   Smokeless tobacco: Never   Tobacco comments:    Social smoker; last smoked in August 2021  Vaping Use   Vaping status: Never Used  Substance Use Topics   Alcohol use: Not Currently    Comment: quit July 19, 2020   Drug use: Never     Allergies   Molds & smuts   Review of Systems Review of Systems  Constitutional:  Negative for chills and fever.  HENT:  Positive for congestion, postnasal drip and rhinorrhea. Negative for ear pain and sore throat.   Eyes:  Negative for pain and visual disturbance.  Respiratory:  Positive for cough, chest tightness and shortness of breath.   Cardiovascular:  Negative for chest pain and palpitations.  Gastrointestinal:  Negative for abdominal pain and vomiting.  Genitourinary:  Negative for dysuria and hematuria.  Musculoskeletal:  Negative for arthralgias and back pain.  Skin:  Negative for color change and rash.  Neurological:  Negative for seizures and syncope.  All other systems reviewed and are negative.    Physical Exam Triage Vital Signs ED Triage Vitals  Encounter Vitals Group     BP 01/23/24 1727 107/71     Systolic BP Percentile --      Diastolic BP Percentile --      Pulse Rate 01/23/24 1727 66     Resp 01/23/24 1727 16     Temp 01/23/24 1727 98.3 F (36.8 C)     Temp Source 01/23/24 1727 Oral     SpO2 01/23/24 1727 98 %     Weight --      Height --      Head Circumference --      Peak Flow --      Pain Score 01/23/24 1725 0     Pain  Loc --      Pain Education --  Exclude from Growth Chart --    No data found.  Updated Vital Signs BP 107/71 (BP Location: Right Arm)   Pulse 66   Temp 98.3 F (36.8 C) (Oral)   Resp 16   LMP 01/03/2024 (Exact Date)   SpO2 98%   Visual Acuity Right Eye Distance:   Left Eye Distance:   Bilateral Distance:    Right Eye Near:   Left Eye Near:    Bilateral Near:     Physical Exam Vitals and nursing note reviewed.  Constitutional:      General: She is not in acute distress.    Appearance: She is well-developed. She is not ill-appearing or toxic-appearing.  HENT:     Head: Normocephalic and atraumatic.     Right Ear: Hearing, tympanic membrane, ear canal and external ear normal.     Left Ear: Hearing, tympanic membrane, ear canal and external ear normal.     Nose: Congestion and rhinorrhea present. Rhinorrhea is clear.     Right Sinus: No maxillary sinus tenderness or frontal sinus tenderness.     Left Sinus: No maxillary sinus tenderness or frontal sinus tenderness.     Mouth/Throat:     Lips: Pink.     Mouth: Mucous membranes are moist.     Pharynx: Uvula midline. No oropharyngeal exudate or posterior oropharyngeal erythema.     Tonsils: No tonsillar exudate.  Eyes:     Conjunctiva/sclera: Conjunctivae normal.     Pupils: Pupils are equal, round, and reactive to light.  Cardiovascular:     Rate and Rhythm: Normal rate and regular rhythm.     Heart sounds: S1 normal and S2 normal. No murmur heard. Pulmonary:     Effort: Pulmonary effort is normal. No respiratory distress.     Breath sounds: Examination of the right-upper field reveals decreased breath sounds. Examination of the left-upper field reveals decreased breath sounds. Examination of the right-middle field reveals decreased breath sounds. Examination of the left-middle field reveals decreased breath sounds. Examination of the right-lower field reveals decreased breath sounds. Examination of the left-lower field  reveals decreased breath sounds. Decreased breath sounds present. No wheezing, rhonchi or rales.  Abdominal:     Palpations: Abdomen is soft.     Tenderness: There is no abdominal tenderness.  Musculoskeletal:        General: No swelling.     Cervical back: Neck supple.  Lymphadenopathy:     Head:     Right side of head: No submental, submandibular, tonsillar, preauricular or posterior auricular adenopathy.     Left side of head: No submental, submandibular, tonsillar, preauricular or posterior auricular adenopathy.     Cervical: No cervical adenopathy.     Right cervical: No superficial cervical adenopathy.    Left cervical: No superficial cervical adenopathy.  Skin:    General: Skin is warm and dry.     Capillary Refill: Capillary refill takes less than 2 seconds.     Findings: No rash.  Neurological:     Mental Status: She is alert and oriented to person, place, and time.  Psychiatric:        Mood and Affect: Mood normal.      UC Treatments / Results  Labs (all labs ordered are listed, but only abnormal results are displayed) Labs Reviewed - No data to display  EKG   Radiology No results found.  Procedures Procedures (including critical care time)  Medications Ordered in UC Medications - No data to display  Initial  Impression / Assessment and Plan / UC Course  I have reviewed the triage vital signs and the nursing notes.  Pertinent labs & imaging results that were available during my care of the patient were reviewed by me and considered in my medical decision making (see chart for details).     Chest x-ray is negative.  Will contact patient if the radiology review differs from my report.  No sign of pneumonia.  Will treat for early bronchitis.  Albuterol with spacer, 2 puffs, every 4 hours as needed for wheezing.  Prednisone, 20 mg, 1 daily for 5 days.  Work excuse provided.  Follow-up if symptoms do not improve, worsen or new symptoms occur. Final Clinical  Impressions(s) / UC Diagnoses   Final diagnoses:  Acute cough  Acute viral bronchitis     Discharge Instructions      Exam was mostly normal, but breath sounds were diminished or quiet.  Chest x-ray appears normal or negative, no pneumonia.  Will treat for early viral bronchitis.  Albuterol with spacer, 2 puffs, every 4 hours, as needed for wheezing.  Prednisone, 20 mg, daily for 5 days.  Follow-up if symptoms do not improve, worsen or new symptoms occur.     ED Prescriptions     Medication Sig Dispense Auth. Provider   albuterol (VENTOLIN HFA) 108 (90 Base) MCG/ACT inhaler Inhale 2 puffs into the lungs every 4 (four) hours as needed for wheezing or shortness of breath. 1 each Prescilla Sours, FNP   Spacer/Aero-Holding Chambers (COMPACT SPACE CHAMBER) DEVI Use with the albuterol inhaler 1 each Prescilla Sours, FNP   predniSONE (DELTASONE) 20 MG tablet Take 1 tablet (20 mg total) by mouth daily with breakfast for 5 days. 5 tablet Prescilla Sours, FNP      PDMP not reviewed this encounter.   Prescilla Sours, FNP 01/23/24 1911

## 2024-01-23 NOTE — Discharge Instructions (Addendum)
Exam was mostly normal, but breath sounds were diminished or quiet.  Chest x-ray appears normal or negative, no pneumonia.  Will treat for early viral bronchitis.  Albuterol with spacer, 2 puffs, every 4 hours, as needed for wheezing.  Prednisone, 20 mg, daily for 5 days.  Follow-up if symptoms do not improve, worsen or new symptoms occur.

## 2024-01-23 NOTE — ED Triage Notes (Signed)
Pt reports still coughing, clear thick mucous, chest tightness.

## 2024-01-30 NOTE — Progress Notes (Unsigned)
 Virtual Visit via Video Note  Consent was obtained for video visit:  Yes.   Answered questions that patient had about telehealth interaction:  Yes.   I discussed the limitations, risks, security and privacy concerns of performing an evaluation and management service by telemedicine. I also discussed with the patient that there may be a patient responsible charge related to this service. The patient expressed understanding and agreed to proceed.  Pt location: Home Physician Location: office Name of referring provider:  Lise Auer, MD I connected with Laurie Guzman at patients initiation/request on 01/31/2024 at 10:30 AM EST by video enabled telemedicine application and verified that I am speaking with the correct person using two identifiers. Pt MRN:  [id number] Pt DOB:  1991-09-04 Video Participants:  Laurie Guzman;  Assessment/Plan:   Migraine without aura, with status migrainosus, not intractable - aggravated by flu and dental pain.  Now improved.        Migraine prevention:  Defer Migraine rescue:  ibuprofen For neck pain: Baclofen 10mg  three times daily as needed Neck stretches Limit use of pain relievers to no more than 2 days out of week to prevent risk of rebound or medication-overuse headache. Keep headache diary Follow up 6 months       Subjective:  Laurie Guzman is a 33 year old female with Bipolar disorder and generalized anxiety disorder who follows up for migraines.  MRI of brain and C-spine personally reviewed.   UPDATE: Due to having an intractable migraine, she had imaging: 11/04/2023 MRI BRAIN W WO:  Unremarkable MRI appearance of the brain. No evidence of an acute intracranial abnormality. 12/30/2023 MRI C-SPINE W WO:  1. Small central disc protrusion at C4-5 without significant stenosis or neural impingement. 2. Minor noncompressive disc bulging at C5-6 and C6-7 without stenosis or impingement. 3. Otherwise unremarkable and normal MRI of the  cervical spine and spinal cord.  Started Aimovig but stopped because she didn't like the way it made her feel Ubrelvy and Zavzpret were ineffective For neck pain, changed from Flexeril to baclofen and referred to PT. Never heard back from PT.  Baclofen helps.  Tried chiropractic.  She started doing home neck stretches that help.  Around the time headaches became intractable, she found out she had the flu.  She also had a recurrent problematic tooth pulled in early December.  Going to get an implant.  Since then, headaches significantly better. Recently had a tooth pulled and going to get an implant.  Now gets 3 a month lasts couple of hours with ibuprofen  She is scheduled for a tilt table test to evaluate for POTS  Rescue protocol:  Usually tries to rest with cold compress.  Sometimes takes Tylenol Current NSAIDS/analgesics:  Tylenol Current triptans:  contraindicated (anaphylaxis) Current ergotamine:  none Current anti-emetic:  none Current muscle relaxants:  Flexeril 10mg  (ineffective) Current Antihypertensive medications:  none Current Antidepressant medications:  none Current Anticonvulsant medications:  none Current anti-CGRP:  none Current Vitamins/Herbal/Supplements:  magniesium, B complex, vit D Current Antihistamines/Decongestants:  none Other therapy:  none Hormone/birth control:  none   Caffeine:  no - causes anxiety. Alcohol:  occasional - every 2 weeks Smoker:  no Diet:  No soda (sometimes Sprite).  Drinks water and juice. Exercise:  not recently.  Feels tired. Depression:  No; Anxiety:  Yes.  Has history of panic attacks Other pain:  Neck pain.  Has TMJ dysfunction. Sleep hygiene:  Good. Sometimes difficulty falling asleep.   Stay at home mom.  Has 3 children.     HISTORY:    Onset:  4 years old. Headache 1: Paroxysmal stabbing headache in various locations, lasting seconds   Headache 2: Severe nonthrobbing occipital pressure with stabbing and cold  sensation   Headache 3: Intensity:  severe Location:  diffuse, neck pain Quality:  throbbing Aura:  absent Prodrome:  absent Associated symptoms:  Blurred vision, dizziness, nausea, photophobia, phonophobia, aural fullness.  She denies associated unilateral numbness or weakness. Duration:  1 to 3 days Frequency:  once to twice a month (much more frequent before starting magnesium) Triggers:  unknown Relieving factors:  rest Activity:  aggravates     MRI of brain without contrast on 11/12/2020 was unremarkable.  MRV of head on 11/29/2020 was normal.           Past NSAIDS/analgesics:  Tylenol, ibuprofen,naproxen Past abortive triptans:  naratriptan 2.5mg , sumatriptan 100mg , rizatriptan 10mg  Past abortive ergotamine:  none Past muscle relaxants:  Flexeril Past anti-emetic:  none Past antihypertensive medications:  beta blockers contraindicated due to hypotension Past antidepressant medications:  nortriptyline 50mg , amitriptyline 25mg  at bedtime, duloxetine 30mg  daily, sertraline, paroxetine Past anticonvulsant medications:  topiramate 50mg  QHS Past anti-CGRP:  Nurtec PRN, Bernita Raisin, Zavzpret NS, Aimovig (made her feel strange) Past vitamins/Herbal/Supplements:  magnesium citrate 400mg  Past antihistamines/decongestants:  Claritin Other past therapies:  none     Family history of headache:  paternal aunts, maternal grandmother  Past Medical History: Past Medical History:  Diagnosis Date   ADHD    Anxiety    Bipolar 1 disorder (HCC)    Bladder spasms    BMI 27.0-27.9,adult    Chlamydia infection 06/2008   Chronic eczematous otitis externa of both ears 04/12/2023   Chronic migraine without aura without status migrainosus, not intractable 08/15/2021   Dysfunction of both eustachian tubes 04/12/2023   Gastritis    GERD (gastroesophageal reflux disease)    Goiter 04/11/2021   IBS (irritable bowel syndrome)    Intermenstrual spotting    Migraine    Palpitations 09/25/2023    Postural lightheadedness 09/25/2023   Pre-eclampsia 08/16/2014   Referred ear pain, bilateral 04/12/2023   Second degree AV block, Mobitz type I, asymptomatic during sleeping hours on Zio patch 09/25/2023   Severe anxiety with panic    Stomach ulcer    Tinnitus of both ears 04/12/2023   UTI (urinary tract infection)    used to get bad UTIs    Medications: Outpatient Encounter Medications as of 01/31/2024  Medication Sig   albuterol (VENTOLIN HFA) 108 (90 Base) MCG/ACT inhaler Inhale 2 puffs into the lungs every 4 (four) hours as needed for wheezing or shortness of breath.   baclofen (LIORESAL) 10 MG tablet Take 1 tablet (10 mg total) by mouth 3 (three) times daily as needed for muscle spasms.   clonazePAM (KLONOPIN) 0.5 MG tablet Take 0.5 mg by mouth daily as needed for anxiety.   metoprolol tartrate (LOPRESSOR) 25 MG tablet Take 0.5 tablets (12.5 mg total) by mouth 2 (two) times daily as needed (as needed for palpations or high rate).   Spacer/Aero-Holding Chambers (COMPACT SPACE CHAMBER) DEVI Use with the albuterol inhaler   No facility-administered encounter medications on file as of 01/31/2024.    Allergies: Allergies  Allergen Reactions   Molds & Smuts     Family History: Family History  Problem Relation Age of Onset   Irritable bowel syndrome Mother    Irritable bowel syndrome Father    Ulcers Father  Stomach Ulcers   Migraines Paternal Grandmother    Migraines Paternal Aunt    Migraines Paternal Aunt     Observations/Objective:   No acute distress.  Alert and oriented.  Speech fluent and not dysarthric.  Language intact.     Follow Up Instructions:    -I discussed the assessment and treatment plan with the patient. The patient was provided an opportunity to ask questions and all were answered. The patient agreed with the plan and demonstrated an understanding of the instructions.   The patient was advised to call back or seek an in-person evaluation if the  symptoms worsen or if the condition fails to improve as anticipated.   Cira Servant, DO

## 2024-01-31 ENCOUNTER — Encounter: Payer: Self-pay | Admitting: Neurology

## 2024-01-31 ENCOUNTER — Telehealth (INDEPENDENT_AMBULATORY_CARE_PROVIDER_SITE_OTHER): Payer: MEDICAID | Admitting: Neurology

## 2024-01-31 DIAGNOSIS — G43009 Migraine without aura, not intractable, without status migrainosus: Secondary | ICD-10-CM

## 2024-03-20 ENCOUNTER — Encounter: Payer: Self-pay | Admitting: Allergy and Immunology

## 2024-03-20 ENCOUNTER — Ambulatory Visit (INDEPENDENT_AMBULATORY_CARE_PROVIDER_SITE_OTHER): Payer: MEDICAID | Admitting: Allergy and Immunology

## 2024-03-20 VITALS — BP 92/60 | HR 68 | Resp 16 | Ht 65.6 in | Wt 166.8 lb

## 2024-03-20 DIAGNOSIS — J4530 Mild persistent asthma, uncomplicated: Secondary | ICD-10-CM | POA: Diagnosis not present

## 2024-03-20 DIAGNOSIS — K219 Gastro-esophageal reflux disease without esophagitis: Secondary | ICD-10-CM | POA: Diagnosis not present

## 2024-03-20 MED ORDER — FLUTICASONE PROPIONATE HFA 44 MCG/ACT IN AERO: INHALATION_SPRAY | RESPIRATORY_TRACT | 5 refills | Status: DC

## 2024-03-20 MED ORDER — FAMOTIDINE 40 MG PO TABS: 40.0000 mg | ORAL_TABLET | Freq: Every day | ORAL | 5 refills | Status: DC

## 2024-03-20 MED ORDER — PANTOPRAZOLE SODIUM 40 MG PO TBEC: 40.0000 mg | DELAYED_RELEASE_TABLET | Freq: Every morning | ORAL | 5 refills | Status: DC

## 2024-03-20 NOTE — Progress Notes (Signed)
 [place] - [place] - [place] - [place] - [place]   Dear Dr. Meredeth Stallion,  Thank you for referring Laurie Guzman to the [facility]  on 03/20/2024.   Below is a summation of this patient's evaluation and recommendations.  Thank you for your referral. I will keep you informed about this patient's response to treatment.   If you have any questions please do not hesitate to contact me.   Sincerely,  Fabienne Holter, MD Allergy / Immunology  [facility]    ______________________________________________________________________    NEW PATIENT NOTE  Referring Provider: Beecher Bower, MD Primary Provider: Beecher Bower, MD Date of office visit: 03/20/2024    Subjective:   Chief Complaint:  Laurie Guzman (DOB: Jan 24, 1991) is a 33 y.o. female who presents to the clinic on 03/20/2024 with a chief complaint of Breathing Problem .     HPI: Laurie Guzman presents to this clinic in evaluation of.  I had last seen her in this clinic during her initial evaluation of 15 November 2020 attempting to address issues of allergic rhinitis and ETD and migraine and anxiety in the context of pregnancy.  She did very well with her previous issues after she moved out of a mold infested house but she developed a new problem Christmas 2024.  She contracted influenza Christmas 2024 and she had a very difficult time spending 10 days in bed and lots of coughing.  Ever since then she has been having unrelenting coughing associated with gagging and retching and postnasal drip and something stuck in her throat and intermittent chest tightness and lots of throat clearing and intermittent raspy voice and feeling a "acid" metallic taste in her throat.  She does not have any chest pain, any significant upper airway symptoms, or classic reflux symptoms other than the fact that she does regurgitate this acid taste into her  mouth.  She did have a history of reflux in the past and required therapy for this disorder including omeprazole and famotidine but she discontinued alcohol consumption, pretty much eliminated all caffeine and chocolate consumption and that pretty much took care of that issue and she does not really require any therapy for her reflux at this point.  Past Medical History:  Diagnosis Date   ADHD    Anxiety    Bipolar 1 disorder (HCC)    Bladder spasms    BMI 27.0-27.9,adult    Chlamydia infection 06/2008   Chronic eczematous otitis externa of both ears 04/12/2023   Chronic migraine without aura without status migrainosus, not intractable 08/15/2021   Dysfunction of both eustachian tubes 04/12/2023   Gastritis    GERD (gastroesophageal reflux disease)    Goiter 04/11/2021   IBS (irritable bowel syndrome)    Intermenstrual spotting    Migraine    Palpitations 09/25/2023   Postural lightheadedness 09/25/2023   Pre-eclampsia 08/16/2014   Referred ear pain, bilateral 04/12/2023   Second degree AV block, Mobitz type I, asymptomatic during sleeping hours on Zio patch 09/25/2023   Severe anxiety with panic    Stomach ulcer    Tinnitus of both ears 04/12/2023   UTI (urinary tract infection)    used to get bad UTIs    Past Surgical History:  Procedure Laterality Date   WISDOM TOOTH EXTRACTION      Allergies as of 03/20/2024       Reactions   Molds & Smuts  Medication List    albuterol 108 (90 Base) MCG/ACT inhaler Commonly known as: VENTOLIN HFA Inhale 2 puffs into the lungs every 4 (four) hours as needed for wheezing or shortness of breath.   B COMPLEX PO Take by mouth as needed.   baclofen 10 MG tablet Commonly known as: LIORESAL Take 1 tablet (10 mg total) by mouth 3 (three) times daily as needed for muscle spasms.   clonazePAM 0.5 MG tablet Commonly known as: KLONOPIN Take 0.5 mg by mouth daily as needed for anxiety.   [employer] Use with  the albuterol inhaler   MAGNESIUM GLYCINATE PO Take by mouth as needed.   rizatriptan 10 MG tablet Commonly known as: MAXALT TAKE 1 TABLET BY MOUTH AS NEEDED FOR HEADACHE. MAY REPEAT IN 2 HOURS   VITAMIN D PO Take by mouth as needed.    Review of systems negative except as noted in HPI / PMHx or noted below:  Review of Systems  Constitutional: Negative.   HENT: Negative.    Eyes: Negative.   Respiratory: Negative.    Cardiovascular: Negative.   Gastrointestinal: Negative.   Genitourinary: Negative.   Musculoskeletal: Negative.   Skin: Negative.   Neurological: Negative.   Endo/Heme/Allergies: Negative.   Psychiatric/Behavioral: Negative.      Family History  Problem Relation Age of Onset   Irritable bowel syndrome Mother    Irritable bowel syndrome Father    Ulcers Father        Stomach Ulcers   Migraines Paternal Aunt    Migraines Paternal Aunt    COPD Paternal Aunt    Allergic rhinitis Maternal Grandmother    COPD Paternal Grandmother    Migraines Paternal Grandmother     Social History   Socioeconomic History   Marital status: Single    Spouse name: Not on file   Number of children: 2   Years of education: Not on file   Highest education level: Some college, no degree  Occupational History   Not on file  Tobacco Use   Smoking status: Some Days    Types: Cigarettes   Smokeless tobacco: Never   Tobacco comments:    Social smoker; last smoked in August 2021  Vaping Use   Vaping status: Never Used  Substance and Sexual Activity   Alcohol use: Yes   Drug use: Yes    Types: Marijuana   Sexual activity: Not on file  Other Topics Concern   Not on file  Social History Narrative   Lives at home with 2 kids and boyfriend    Left handed   Caffeine: "not really"   Social Drivers of Corporate investment banker Strain: Not on file  Food Insecurity: Not on file  Transportation Needs: Not on file  Physical Activity: Not on file  Stress: Not on file   Social Connections: Unknown (04/23/2022)   Received from [facility], [facility]   Social Network    Social Network: Not on file  Intimate Partner Violence: Unknown (03/15/2022)   Received from [facility], [facility]   HITS    Physically Hurt: Not on file    Insult or Talk Down To: Not on file    Threaten Physical Harm: Not on file    Scream or Curse: Not on file    Environmental and Social history  Lives in a apartment with a dry environment, a hamster and a dog located inside the household, no carpet in the bedroom, no plastic on the  bed, no plastic on the pillow, no smoking ongoing with inside the household.  Objective:   Vitals:   03/20/24 0922  BP: 92/60  Pulse: 68  Resp: 16  SpO2: 97%   Height: 5' 5.6" (166.6 cm) Weight: 166 lb 12.8 oz (75.7 kg)  Physical Exam Constitutional:      Appearance: She is not diaphoretic.  HENT:     Head: Normocephalic.     Right Ear: Tympanic membrane, ear canal and external ear normal.     Left Ear: Tympanic membrane, ear canal and external ear normal.     Nose: Nose normal. No mucosal edema or rhinorrhea.     Mouth/Throat:     Pharynx: Uvula midline. No oropharyngeal exudate.  Eyes:     Conjunctiva/sclera: Conjunctivae normal.  Neck:     Thyroid: No thyromegaly.     Trachea: Trachea normal. No tracheal tenderness or tracheal deviation.  Cardiovascular:     Rate and Rhythm: Normal rate and regular rhythm.     Heart sounds: Normal heart sounds, S1 normal and S2 normal. No murmur heard. Pulmonary:     Effort: No respiratory distress.     Breath sounds: Normal breath sounds. No stridor. No wheezing or rales.  Lymphadenopathy:     Head:     Right side of head: No tonsillar adenopathy.     Left side of head: No tonsillar adenopathy.     Cervical: No cervical adenopathy.  Skin:    Findings: No erythema or rash.     Nails: There is no clubbing.  Neurological:     Mental Status: She is alert.     Diagnostics: Allergy  skin tests were not performed.   Spirometry was performed and demonstrated an FEV1 of 3.18 @ 95 % of predicted. FEV1/FVC = 0.81  Results of a chest x-ray obtained 23 January 2024 identifies the following:  The heart size and mediastinal contours are within normal limits. Both lungs are clear. The visualized skeletal structures are unremarkable.  Assessment and Plan:    1. Not well controlled mild persistent asthma   2. LPRD (laryngopharyngeal reflux disease)    1. Treat and prevent inflammation of airway:   A. Fluticasone 44 - 2 inhalations 2 times per day w/ spacer (empty lungs)  2. Treat and prevent reflux induced inflammation of airway:   A. Replace all throat clearing with drinking / swallowing maneuver  B. Eliminate all caffeine consumption  C. Pantoprazole 40 mg - 1 tablet in AM  D. Famotidine 40 mg - 1 tablet in PM  3. If needed:   A. Albuterol + fluticasone - 2 inhalations TOGETHER every 4-6 hours  B. OTC Mucinex DM - 2 times per day  4. Return to clinic in 4 weeks or earlier if problem  Laurie Guzman appears to have an inflamed and irritated respiratory tract and I suspect that the inflammation that developed around the time of her influenza infection that gave rise to very significant coughing probably precipitated her reflux events which have affected her mid airway and we are going to aggressively treat this issue with the therapy noted above while she uses anti-inflammatory agents for her lower airway.  I will see her back in this clinic in 4 weeks.  Fabienne Holter, MD Allergy / Immunology [place] [facility] 

## 2024-03-20 NOTE — Patient Instructions (Addendum)
  1. Treat and prevent inflammation of airway:   A. Fluticasone 44 - 2 inhalations 2 times per day w/ spacer (empty lungs)  2. Treat and prevent reflux induced inflammation of airway:   A. Replace all throat clearing with drinking / swallowing maneuver  B. Eliminate all caffeine consumption  C. Pantoprazole 40 mg - 1 tablet in AM  D. Famotidine 40 mg - 1 tablet in PM  3. If needed:   A. Albuterol + fluticasone - 2 inhalations TOGETHER every 4-6 hours  B. OTC Mucinex DM - 2 times per day  4. Return to clinic in 4 weeks or earlier if problem

## 2024-03-24 ENCOUNTER — Encounter: Payer: Self-pay | Admitting: Allergy and Immunology

## 2024-04-03 ENCOUNTER — Encounter: Payer: Self-pay | Admitting: Allergy and Immunology

## 2024-04-03 DIAGNOSIS — K219 Gastro-esophageal reflux disease without esophagitis: Secondary | ICD-10-CM

## 2024-04-03 MED ORDER — DEXILANT 60 MG PO CPDR: 60.0000 mg | DELAYED_RELEASE_CAPSULE | ORAL | 5 refills | Status: DC

## 2024-04-17 ENCOUNTER — Ambulatory Visit: Payer: MEDICAID | Admitting: Allergy and Immunology

## 2024-04-17 ENCOUNTER — Other Ambulatory Visit: Payer: Self-pay

## 2024-04-17 MED ORDER — SYMBICORT 160-4.5 MCG/ACT IN AERO: INHALATION_SPRAY | RESPIRATORY_TRACT | 5 refills | Status: DC

## 2024-04-21 NOTE — Addendum Note (Signed)
 Addended by: Kenny Peals on: 04/21/2024 03:48 PM   Modules accepted: Orders

## 2024-04-23 ENCOUNTER — Ambulatory Visit: Payer: MEDICAID | Admitting: Allergy and Immunology

## 2024-05-21 ENCOUNTER — Encounter: Payer: Self-pay | Admitting: Pulmonary Disease

## 2024-05-21 ENCOUNTER — Ambulatory Visit: Payer: MEDICAID | Admitting: Pulmonary Disease

## 2024-05-21 ENCOUNTER — Ambulatory Visit: Payer: MEDICAID

## 2024-05-21 VITALS — BP 90/70 | HR 58 | Ht 65.0 in | Wt 166.2 lb

## 2024-05-21 DIAGNOSIS — R053 Chronic cough: Secondary | ICD-10-CM | POA: Diagnosis not present

## 2024-05-21 DIAGNOSIS — F17210 Nicotine dependence, cigarettes, uncomplicated: Secondary | ICD-10-CM | POA: Diagnosis not present

## 2024-05-21 DIAGNOSIS — R0602 Shortness of breath: Secondary | ICD-10-CM

## 2024-05-21 MED ORDER — PREDNISONE 20 MG PO TABS
20.0000 mg | ORAL_TABLET | Freq: Every day | ORAL | 0 refills | Status: DC
Start: 2024-05-21 — End: 2024-09-03

## 2024-05-21 MED ORDER — FLUTICASONE PROPIONATE HFA 110 MCG/ACT IN AERO: 2.0000 | INHALATION_SPRAY | Freq: Two times a day (BID) | RESPIRATORY_TRACT | 12 refills | Status: DC

## 2024-05-21 NOTE — Progress Notes (Signed)
 Laurie Guzman    [id number]    [birth year]  Primary Care Physician:Khan, Lory Rough, MD  Referring Physician: Beecher Bower, MD [address] [place],  [place] [address]  Chief complaint:   Patient being seen for chronic cough  HPI:  Has had a cough and congestion since having the flu in December  Inhalers have not helped previously  Has cough, congestion No fevers, no chills  Previously when she had a course of prednisone  it did help but the coughing came back when she was done with the inhaler  Was on Flovent  previously, did not feel it helped-this was Flovent  44 Albuterol  does cause palpitations  Did not have any underlying breathing problems prior to having the influenza  Does not smoke cigarettes Did smoke marijuana in the past  Does not have underlying lung disease known to her  Cough is occasionally productive sometimes with creamy phlegm sometimes just a dry cough Sometimes worse in the evenings  Does have a history of anxiety, history of second-degree AV block, GERD, irritable bowel.  History of bipolar disorder, ADHD, history of anxiety with panic disorder  Did have a recent appointment with an allergist-was not found to be allergic to any specific triggers   Outpatient Encounter Medications as of 05/21/2024  Medication Sig   albuterol  (VENTOLIN  HFA) 108 (90 Base) MCG/ACT inhaler Inhale 2 puffs into the lungs every 4 (four) hours as needed for wheezing or shortness of breath.   B Complex Vitamins (B COMPLEX PO) Take by mouth as needed.   baclofen  (LIORESAL ) 10 MG tablet Take 1 tablet (10 mg total) by mouth 3 (three) times daily as needed for muscle spasms.   clonazePAM (KLONOPIN) 0.5 MG tablet Take 0.5 mg by mouth daily as needed for anxiety.   DEXILANT  60 MG capsule Take 1 capsule (60 mg total) by mouth every morning.   escitalopram (LEXAPRO) 5 MG tablet Take 5 mg by mouth daily.   famotidine  (PEPCID ) 40 MG tablet Take 1 tablet (40 mg total) by  mouth at bedtime.   fluticasone  (FLOVENT  HFA) 44 MCG/ACT inhaler Inhale two puffs with spacer twice daily to prevent cough or wheeze. Can use two puffs every four to six hours with Albuterol  during flare-up. Rinse, gargle, and spit after use.   MAGNESIUM GLYCINATE PO Take by mouth as needed.   rizatriptan (MAXALT) 10 MG tablet TAKE 1 TABLET BY MOUTH AS NEEDED FOR HEADACHE. MAY REPEAT IN 2 HOURS   Spacer/Aero-Holding Chambers (COMPACT SPACE CHAMBER) DEVI Use with the albuterol  inhaler   SYMBICORT  160-4.5 MCG/ACT inhaler Inhale two puffs twice daily to prevent cough or wheeze.  Rinse, gargle, and spit after use.   VITAMIN D PO Take by mouth as needed.   No facility-administered encounter medications on file as of 05/21/2024.    Allergies as of 05/21/2024 - Review Complete 05/21/2024  Allergen Reaction Noted   Molds & smuts  12/02/2020    Past Medical History:  Diagnosis Date   ADHD    Anxiety    Bipolar 1 disorder (HCC)    Bladder spasms    BMI 27.0-27.9,adult    Chlamydia infection 06/2008   Chronic eczematous otitis externa of both ears 04/12/2023   Chronic migraine without aura without status migrainosus, not intractable 08/15/2021   Dysfunction of both eustachian tubes 04/12/2023   Gastritis    GERD (gastroesophageal reflux disease)    Goiter 04/11/2021   IBS (irritable bowel syndrome)  Intermenstrual spotting    Migraine    Palpitations 09/25/2023   Postural lightheadedness 09/25/2023   Pre-eclampsia 08/16/2014   Referred ear pain, bilateral 04/12/2023   Second degree AV block, Mobitz type I, asymptomatic during sleeping hours on Zio patch 09/25/2023   Severe anxiety with panic    Stomach ulcer    Tinnitus of both ears 04/12/2023   UTI (urinary tract infection)    used to get bad UTIs    Past Surgical History:  Procedure Laterality Date   WISDOM TOOTH EXTRACTION      Family History  Problem Relation Age of Onset   Irritable bowel syndrome Mother    Irritable  bowel syndrome Father    Ulcers Father        Stomach Ulcers   Migraines Paternal Aunt    Migraines Paternal Aunt    COPD Paternal Aunt    Allergic rhinitis Maternal Grandmother    COPD Paternal Grandmother    Migraines Paternal Grandmother     Social History   Socioeconomic History   Marital status: Single    Spouse name: Not on file   Number of children: 2   Years of education: Not on file   Highest education level: Some college, no degree  Occupational History   Not on file  Tobacco Use   Smoking status: Some Days    Types: Cigarettes   Smokeless tobacco: Never   Tobacco comments:    Social smoker; last smoked in August 2021  Vaping Use   Vaping status: Never Used  Substance and Sexual Activity   Alcohol use: Yes   Drug use: Yes    Types: Marijuana   Sexual activity: Not on file  Other Topics Concern   Not on file  Social History Narrative   Lives at home with 2 kids and boyfriend    Left handed   Caffeine: not really   Social Drivers of Corporate investment banker Strain: Not on file  Food Insecurity: Not on file  Transportation Needs: Not on file  Physical Activity: Not on file  Stress: Not on file  Social Connections: Unknown (04/23/2022)   Received from [facility]   Social Network    Social Network: Not on file  Intimate Partner Violence: Unknown (03/15/2022)   Received from [facility], [facility]   HITS    Physically Hurt: Not on file    Insult or Talk Down To: Not on file    Threaten Physical Harm: Not on file    Scream or Curse: Not on file    Review of Systems  Respiratory:  Positive for cough, shortness of breath and wheezing.     Vitals:   05/21/24 1247  BP: 90/70  Pulse: (!) 58  SpO2: 99%     Physical Exam Constitutional:      Appearance: Normal appearance.  HENT:     Head: Normocephalic.     Mouth/Throat:     Mouth: Mucous membranes are moist.  Eyes:     General: No scleral icterus. Cardiovascular:      Rate and Rhythm: Normal rate and regular rhythm.     Heart sounds: No murmur heard.    No friction rub.  Pulmonary:     Effort: No respiratory distress.     Breath sounds: No stridor. No wheezing or rhonchi.  Musculoskeletal:     Cervical back: No rigidity or tenderness.  Neurological:     Mental Status: She is alert.  Psychiatric:  Mood and Affect: Mood normal.    Data Reviewed: Chest x-ray February 2025 reviewed-no acute infiltrate  Assessment:  Persistent cough, shortness of breath post influenza infection in December  Steroids appear to have helped previously  Not having any symptoms suggesting a bacterial infection at present  Symptoms have been recurrent and persistent  Plan/Recommendations: Schedule for pulmonary function test  Schedule for chest x-ray today  Prescription for Flovent  110 2 puffs twice a day  Prescription for prednisone  20 daily for 5 to 7 days  Follow-up in about 6 weeks  Encouraged to call with significant concerns  Stay away from any types of smoking   Myer Artis MD [facility] 05/21/2024, 12:52 PM  CC: Beecher Bower, MD

## 2024-05-21 NOTE — Patient Instructions (Addendum)
 Obtain chest x-ray  Schedule for pulmonary function test  Flovent  2 puffs twice a day - We are staying away from anything with albuterol -since you have palpitations  Prednisone -20 mg daily for about 5 to 7 days  Will see you back in about 6 weeks  Call us  with significant concerns

## 2024-05-22 ENCOUNTER — Encounter: Payer: Self-pay | Admitting: Pulmonary Disease

## 2024-06-25 NOTE — Telephone Encounter (Signed)
 FYI

## 2024-06-25 NOTE — Telephone Encounter (Signed)
**Note De-identified  [place] Obfuscation** Please advise 

## 2024-06-30 ENCOUNTER — Other Ambulatory Visit: Payer: Self-pay | Admitting: *Deleted

## 2024-06-30 DIAGNOSIS — R053 Chronic cough: Secondary | ICD-10-CM

## 2024-07-02 ENCOUNTER — Encounter (INDEPENDENT_AMBULATORY_CARE_PROVIDER_SITE_OTHER): Payer: Self-pay | Admitting: Otolaryngology

## 2024-07-02 ENCOUNTER — Ambulatory Visit: Payer: MEDICAID | Admitting: Pulmonary Disease

## 2024-07-02 ENCOUNTER — Ambulatory Visit (INDEPENDENT_AMBULATORY_CARE_PROVIDER_SITE_OTHER): Payer: MEDICAID | Admitting: Otolaryngology

## 2024-07-02 VITALS — BP 103/69 | HR 70

## 2024-07-02 DIAGNOSIS — R0982 Postnasal drip: Secondary | ICD-10-CM

## 2024-07-02 DIAGNOSIS — J343 Hypertrophy of nasal turbinates: Secondary | ICD-10-CM | POA: Diagnosis not present

## 2024-07-02 DIAGNOSIS — R053 Chronic cough: Secondary | ICD-10-CM

## 2024-07-02 DIAGNOSIS — R0981 Nasal congestion: Secondary | ICD-10-CM

## 2024-07-02 DIAGNOSIS — J342 Deviated nasal septum: Secondary | ICD-10-CM | POA: Diagnosis not present

## 2024-07-02 DIAGNOSIS — K219 Gastro-esophageal reflux disease without esophagitis: Secondary | ICD-10-CM | POA: Diagnosis not present

## 2024-07-02 DIAGNOSIS — J3089 Other allergic rhinitis: Secondary | ICD-10-CM

## 2024-07-02 LAB — PULMONARY FUNCTION TEST
DL/VA % pred: 99 %
DL/VA: 4.51 ml/min/mmHg/L
DLCO cor % pred: 107 %
DLCO cor: 24.86 ml/min/mmHg
DLCO unc % pred: 107 %
DLCO unc: 24.86 ml/min/mmHg
FEF 25-75 Post: 3.66 L/s
FEF 25-75 Pre: 2.59 L/s
FEF2575-%Change-Post: 41 %
FEF2575-%Pred-Post: 105 %
FEF2575-%Pred-Pre: 74 %
FEV1-%Change-Post: 12 %
FEV1-%Pred-Post: 111 %
FEV1-%Pred-Pre: 99 %
FEV1-Post: 3.63 L
FEV1-Pre: 3.22 L
FEV1FVC-%Change-Post: 7 %
FEV1FVC-%Pred-Pre: 88 %
FEV6-%Change-Post: 5 %
FEV6-%Pred-Post: 118 %
FEV6-%Pred-Pre: 112 %
FEV6-Post: 4.55 L
FEV6-Pre: 4.32 L
FEV6FVC-%Pred-Post: 101 %
FEV6FVC-%Pred-Pre: 101 %
FVC-%Change-Post: 5 %
FVC-%Pred-Post: 116 %
FVC-%Pred-Pre: 111 %
FVC-Post: 4.55 L
FVC-Pre: 4.33 L
Post FEV1/FVC ratio: 80 %
Post FEV6/FVC ratio: 100 %
Pre FEV1/FVC ratio: 75 %
Pre FEV6/FVC Ratio: 100 %
RV % pred: 124 %
RV: 1.84 L
TLC % pred: 118 %
TLC: 6.18 L

## 2024-07-02 MED ORDER — FAMOTIDINE 20 MG PO TABS: 20.0000 mg | ORAL_TABLET | Freq: Two times a day (BID) | ORAL | 3 refills | Status: DC

## 2024-07-02 MED ORDER — FLUTICASONE PROPIONATE 50 MCG/ACT NA SUSP: 2.0000 | Freq: Every day | NASAL | 6 refills | Status: DC

## 2024-07-02 MED ORDER — LEVOCETIRIZINE DIHYDROCHLORIDE 5 MG PO TABS: 5.0000 mg | ORAL_TABLET | Freq: Every evening | ORAL | 3 refills | Status: DC

## 2024-07-02 NOTE — Patient Instructions (Addendum)
 [employer]Lesson[employer] - check out this website to learn more about reflux   -Avoid lying down for at least two hours after a meal or after drinking acidic beverages, like soda, or other caffeinated beverages. This can help to prevent stomach contents from flowing back into the esophagus. -Keep your head elevated while you sleep. Using an extra pillow or two can also help to prevent reflux. -Eat smaller and more frequent meals each day instead of a few large meals. This promotes digestion and can aid in preventing heartburn. -Wear loose-fitting clothes to ease pressure on the stomach, which can worsen heartburn and reflux. -Reduce excess weight around the midsection. This can ease pressure on the stomach. Such pressure can force some stomach contents back up the esophagus   - Take Reflux Gourmet (natural supplement available on [employer]) to help with symptoms of chronic throat irritation      Superior laryngeal nerve block for chronic cough, throat clearing, or pain  Some patients have symptoms from inflammation or hypersensitivity of the nerve that provides sensation to the inside of the throat. This nerve enters the throat right above the Adam's apple, and there is one on each side. It is called the "superior laryngeal nerve."  An injection of lidocaine and steroid can be given to this area where the sensory nerve enters the throat.  This sometimes helps patients with an oversensitive nerve or cough reflex. It "resets" an abnormal cough threshold or overactive pain signals from the throat. The steroid acts to decrease any inflammation around the nerve that could be adding to this hypersensitivity.  The injection may help right away, or it may take up to a week or two to start working.  If it hasn't helped at all after 2-3 weeks, Dr Tabithia Stroder will often try a second injection.  Some patients with no response to the first injection still have a good response to the second one.  There is no set  schedule for repeating these injections.  It is based entirely on how long they help your symptoms.  If you find the injection helpful but the symptoms come back several months later, then you can repeat it at that time.  Some patients never need a second injection.  Side effects from the injection are mostly related to numbness inside the throat.  If this happens, you may feel one or both sides of your throat go numb. This will last about 1 hour, and you should not swallow anything until the sensation returns to normal, usually about an hour.  It is sometimes frightening to patients when the sensation inside their throat changes, but it is not dangerous.  You may choose to stay in our waiting room until the feeling goes away, but this is not a requirement.  If this happens, it will improve gradually over the hour after the procedure. Other risks include accidental injection into the blood vessels, pain, temporary swallowing trouble or voice changes, and side effects related to steroids generally, like raised blood sugar or increased appetite. Many patients do not notice any steroid-related side effects from the injection, but if you have never taken steroids or aren't familiar with their effects, you should ask Dr Kassiah Mccrory for additional details.  Diabetics shouyld plan to check their blood sugar more frequently in the 2 days after the injection.  The injection takes approximately 1 minute per side, and\ it is performed during a routine office visit. There are no precautions afterward except for not swallowing for 1 hour if  throat numbness occurs. Patients can drive themselves to and from the visit. There is no recovery time, but some patients have mild bruising or tenderness at the injection site.

## 2024-07-02 NOTE — Progress Notes (Signed)
 ENT CONSULT:  Reason for Consult: chronic cough    HPI: Discussed the use of AI scribe software for clinical note transcription with the patient, who gave verbal consent to proceed.  History of Present Illness Laurie Guzman is a 33 year old female who presents with chronic cough and postnasal drip.  She has experienced chronic cough with constant mucus production and frequent postnasal drip since having the flu around Christmas, approximately seven months ago. The mucus feels like 'something in the back of my throat' that she tries to cough out, resulting in a 'jello spit' consistency, but the cough is mostly dry. The cough is persistent, with flare-ups occurring at any time of the day. No improvement with allergy medications. Lozenges and water provide minimal relief. A chest x-ray in the past was normal as part of her workup for chronic cough.  She has a history of gastritis and was previously treated with acid reflux medications such as Dexilant  and Pepcid , which were ineffective. She reports a burning sensation in her throat when bending over, initially treated with acid medications that did not alleviate her symptoms. She stopped these medications and modified her diet, which helped until the flu exacerbated her symptoms again.  She has tried various treatments for her symptoms, including nasal sprays and allergy pills, which were ineffective. She finds some relief with Benadryl , taken at night depending on the severity of her symptoms. Inhalers initially seemed to help but did not provide lasting relief.  She reports developing tonsil stones, which she had not experienced before.    Records Reviewed:  Office Visit with Dr Neda 05/21/24 Has had a cough and congestion since having the flu in December   Inhalers have not helped previously   Has cough, congestion No fevers, no chills   Previously when she had a course of prednisone  it did help but the coughing came back when she  was done with the inhaler   Was on Flovent  previously, did not feel it helped-this was Flovent  44 Albuterol  does cause palpitations   Did not have any underlying breathing problems prior to having the influenza   Does not smoke cigarettes Did smoke marijuana in the past   Does not have underlying lung disease known to her   Cough is occasionally productive sometimes with creamy phlegm sometimes just a dry cough Sometimes worse in the evenings   Does have a history of anxiety, history of second-degree AV block, GERD, irritable bowel.  History of bipolar disorder, ADHD, history of anxiety with panic disorder   Did have a recent appointment with an allergist-was not found to be allergic to any specific tr    Past Medical History:  Diagnosis Date   ADHD    Anxiety    Bipolar 1 disorder (HCC)    Bladder spasms    BMI 27.0-27.9,adult    Chlamydia infection 06/2008   Chronic eczematous otitis externa of both ears 04/12/2023   Chronic migraine without aura without status migrainosus, not intractable 08/15/2021   Dysfunction of both eustachian tubes 04/12/2023   Gastritis    GERD (gastroesophageal reflux disease)    Goiter 04/11/2021   IBS (irritable bowel syndrome)    Intermenstrual spotting    Migraine    Palpitations 09/25/2023   Postural lightheadedness 09/25/2023   Pre-eclampsia 08/16/2014   Referred ear pain, bilateral 04/12/2023   Second degree AV block, Mobitz type I, asymptomatic during sleeping hours on Zio patch 09/25/2023   Severe anxiety with panic    Stomach  ulcer    Tinnitus of both ears 04/12/2023   UTI (urinary tract infection)    used to get bad UTIs    Past Surgical History:  Procedure Laterality Date   WISDOM TOOTH EXTRACTION      Family History  Problem Relation Age of Onset   Irritable bowel syndrome Mother    Irritable bowel syndrome Father    Ulcers Father        Stomach Ulcers   Migraines Paternal Aunt    Migraines Paternal Aunt    COPD  Paternal Aunt    Allergic rhinitis Maternal Grandmother    COPD Paternal Grandmother    Migraines Paternal Grandmother     Social History:  reports that she has been smoking cigarettes. She has never used smokeless tobacco. She reports current alcohol use. She reports current drug use. Drug: Marijuana.  Allergies:  Allergies  Allergen Reactions   Molds & Smuts     Medications: I have reviewed the patient's current medications.  The PMH, PSH, Medications, Allergies, and SH were reviewed and updated.  ROS: Constitutional: Negative for fever, weight loss and weight gain. Cardiovascular: Negative for chest pain and dyspnea on exertion. Respiratory: Is not experiencing shortness of breath at rest. Gastrointestinal: Negative for nausea and vomiting. Neurological: Negative for headaches. Psychiatric: The patient is not nervous/anxious  Blood pressure 103/69, pulse 70, SpO2 97%, unknown if currently breastfeeding. There is no height or weight on file to calculate BMI.  PHYSICAL EXAM:  Exam: General: Well-developed, well-nourished Respiratory Respiratory effort: Equal inspiration and expiration without stridor Cardiovascular Peripheral Vascular: Warm extremities with equal color/perfusion Eyes: No nystagmus with equal extraocular motion bilaterally Neuro/Psych/Balance: Patient oriented to person, place, and time; Appropriate mood and affect; Gait is intact with no imbalance; Cranial nerves I-XII are intact Head and Face Inspection: Normocephalic and atraumatic without mass or lesion Palpation: Facial skeleton intact without bony stepoffs Salivary Glands: No mass or tenderness Facial Strength: Facial motility symmetric and full bilaterally ENT Pinna: External ear intact and fully developed External canal: Canal is patent with intact skin Tympanic Membrane: Clear and mobile External Nose: No scar or anatomic deformity Internal Nose: Septum is deviated to the left. No polyp, or  purulence. Mucosal edema and erythema present.  Bilateral inferior turbinate hypertrophy.  Lips, Teeth, and gums: Mucosa and teeth intact and viable TMJ: No pain to palpation with full mobility Oral cavity/oropharynx: No erythema or exudate, no lesions present Nasopharynx: No mass or lesion with intact mucosa Hypopharynx: Intact mucosa without pooling of secretions Larynx Glottic: Full true vocal cord mobility without lesion or mass Supraglottic: Normal appearing epiglottis and AE folds Interarytenoid Space: Moderate pachydermia&edema Subglottic Space: Patent without lesion or edema Neck Neck and Trachea: Midline trachea without mass or lesion Thyroid: No mass or nodularity Lymphatics: No lymphadenopathy  Procedure: Preoperative diagnosis: chronic cough   Postoperative diagnosis:   Same + GERD LPR  Procedure: Flexible fiberoptic laryngoscopy  Surgeon: Teria Khachatryan, MD  Anesthesia: Topical lidocaine and Afrin Complications: None Condition is stable throughout exam  Indications and consent:  The patient presents to the clinic with above symptoms. Indirect laryngoscopy view was incomplete. Thus it was recommended that they undergo a flexible fiberoptic laryngoscopy. All of the risks, benefits, and potential complications were reviewed with the patient preoperatively and verbal informed consent was obtained.  Procedure: The patient was seated upright in the clinic. Topical lidocaine and Afrin were applied to the nasal cavity. After adequate anesthesia had occurred, I then proceeded to pass the  flexible telescope into the nasal cavity. The nasal cavity was patent without rhinorrhea or polyp. The nasopharynx was also patent without mass or lesion. The base of tongue was visualized and was normal. There were no signs of pooling of secretions in the piriform sinuses. The true vocal folds were mobile bilaterally. There were no signs of glottic or supraglottic mucosal lesion or mass. There  was moderate interarytenoid pachydermia and post cricoid edema. The telescope was then slowly withdrawn and the patient tolerated the procedure throughout.    PROCEDURE NOTE: nasal endoscopy  Preoperative diagnosis: chronic nasal congestion symptoms  Postoperative diagnosis: same  Procedure: Diagnostic nasal endoscopy ([id number])  Surgeon: Elena Larry, M.D.  Anesthesia: Topical lidocaine and Afrin  H&P REVIEW: The patient's history and physical were reviewed today prior to procedure. All medications were reviewed and updated as well. Complications: None Condition is stable throughout exam Indications and consent: The patient presents with symptoms of chronic sinusitis not responding to previous therapies. All the risks, benefits, and potential complications were reviewed with the patient preoperatively and informed consent was obtained. The time out was completed with confirmation of the correct procedure.   Procedure: The patient was seated upright in the clinic. Topical lidocaine and Afrin were applied to the nasal cavity. After adequate anesthesia had occurred, the rigid nasal endoscope was passed into the nasal cavity. The nasal mucosa, turbinates, septum, and sinus drainage pathways were visualized bilaterally. This revealed no purulence or significant secretions that might be cultured. There were no polyps or sites of significant inflammation. The mucosa was intact and there was no crusting present. The scope was then slowly withdrawn and the patient tolerated the procedure well. There were no complications or blood loss.      Studies Reviewed: CXR 05/21/24 FINDINGS: Normal heart, mediastinum and hila.   Clear lungs.  No pleural effusion or pneumothorax.   Skeletal structures are unremarkable.   IMPRESSION: Normal chest radiographs.  Assessment/Plan: Encounter Diagnoses  Name Primary?   Chronic cough Yes   Chronic GERD    Environmental and seasonal allergies     Post-nasal drip    Chronic nasal congestion    Hypertrophy of both inferior nasal turbinates    Nasal septal deviation     Assessment and Plan Assessment & Plan Chronic cough Persistent cough post-flu with mucus, postnasal drip, and throat clearing. Hx of GERD. Not on medications. Ineffective treatment with inhalers and acid reflux medications. Possible neurogenic cough due to hypersensitive cough reflex vs cough due to post-nasal drainage or GERD LPR - Prescribed Pepcid  20 mg morning and night. - Recommended Reflux Gourmet after meals. - Consider superior laryngeal nerve block if cough persists when she returns.  Chronic nasal congestion and post-nasal drip suspected environmental allergies Frequent postnasal drip contributing to cough and throat clearing. Ineffective nasal sprays and allergy pills. Benadryl  provides some relief. - Prescribed Xyzal  at night. - Recommended continued use of Nasacort nasal spray morning and night.  Gastroesophageal reflux disease (GERD) LPR GERD with throat burning and mucus production. Ineffective acid reflux medications.  -  Reflux Gourmet after meals - diet and lifestyle changes to minimize GERD - Refer to [employer] blog for dietary and lifestyle modifications/reflux cook book   Thank you for allowing me to participate in the care of this patient. Please do not hesitate to contact me with any questions or concerns.   Elena Larry, MD Otolaryngology [facility] Phone: [phone] Fax: [phone]    07/02/2024, 2:17 PM

## 2024-07-02 NOTE — Patient Instructions (Signed)
 Full PFT performed today.

## 2024-07-02 NOTE — Progress Notes (Signed)
 Full PFT performed today.

## 2024-08-04 ENCOUNTER — Encounter (HOSPITAL_BASED_OUTPATIENT_CLINIC_OR_DEPARTMENT_OTHER): Payer: Self-pay

## 2024-08-04 ENCOUNTER — Other Ambulatory Visit (HOSPITAL_BASED_OUTPATIENT_CLINIC_OR_DEPARTMENT_OTHER): Payer: Self-pay

## 2024-08-04 ENCOUNTER — Ambulatory Visit: Payer: MEDICAID | Admitting: Neurology

## 2024-08-04 ENCOUNTER — Ambulatory Visit (HOSPITAL_BASED_OUTPATIENT_CLINIC_OR_DEPARTMENT_OTHER)
Admission: EM | Admit: 2024-08-04 | Discharge: 2024-08-04 | Disposition: A | Discharge status: Home / Self Care | Payer: MEDICAID | Attending: Family Medicine | Admitting: Family Medicine

## 2024-08-04 DIAGNOSIS — K2900 Acute gastritis without bleeding: Secondary | ICD-10-CM | POA: Diagnosis not present

## 2024-08-04 DIAGNOSIS — R300 Dysuria: Secondary | ICD-10-CM | POA: Diagnosis not present

## 2024-08-04 LAB — POCT URINE DIPSTICK
Bilirubin, UA: NEGATIVE
Glucose, UA: NEGATIVE mg/dL
Ketones, POC UA: NEGATIVE mg/dL
Leukocytes, UA: NEGATIVE
Nitrite, UA: NEGATIVE
Protein Ur, POC: NEGATIVE mg/dL
Spec Grav, UA: 1.025 (ref 1.010–1.025)
Urobilinogen, UA: 0.2 U/dL
pH, UA: 5.5 (ref 5.0–8.0)

## 2024-08-04 MED ORDER — SUCRALFATE 1 G PO TABS
1.0000 g | ORAL_TABLET | Freq: Three times a day (TID) | ORAL | 0 refills | Status: DC
  Filled 2024-08-04: qty 56, 14d supply, fill #0

## 2024-08-04 MED ORDER — PANTOPRAZOLE SODIUM 40 MG PO TBEC
40.0000 mg | DELAYED_RELEASE_TABLET | Freq: Every day | ORAL | 0 refills | Status: DC
  Filled 2024-08-04: qty 14, 14d supply, fill #0

## 2024-08-04 MED ORDER — ALUM & MAG HYDROXIDE-SIMETH 200-200-20 MG/5ML PO SUSP
30.0000 mL | Freq: Once | ORAL | Status: AC
  Administered 2024-08-04: 30 mL via ORAL

## 2024-08-04 MED ORDER — LIDOCAINE VISCOUS HCL 2 % MT SOLN
15.0000 mL | Freq: Once | OROMUCOSAL | Status: AC
  Administered 2024-08-04: 15 mL via OROMUCOSAL

## 2024-08-04 MED ORDER — PROMETHAZINE HCL 25 MG PO TABS
25.0000 mg | ORAL_TABLET | Freq: Three times a day (TID) | ORAL | 0 refills | Status: DC | PRN
  Filled 2024-08-04: qty 30, 10d supply, fill #0

## 2024-08-04 NOTE — ED Notes (Signed)
 Patient states that she feels relief from sharp pain but that nausea is still present but not as strong. She still feels the twisting sensation but not as strong. Provider notified.

## 2024-08-04 NOTE — ED Provider Notes (Signed)
 [name]    CSN: [id number] Arrival date & time: 08/04/24  0825      History   Chief Complaint Chief Complaint  Patient presents with   Abdominal Pain   Diarrhea   Dysuria   Nausea    HPI Laurie Guzman is Guzman 33 y.o. female.   Pt is Guzman 33 year old female that presents with abdominal burning, nausea. Since Friday morning she's had stomach pain, burning, diarrhea, with bubby stool. Took zofran  but states it's not workng well. Patient feels weak, has chronic stomach issues, takes famotidine , has hx of GERD, gastritis, reflux, past hospitalization for stomach ulcer. Patient experiencing frequency and burning with urination but states that this is an ongoing issue. Patient reports she's eating and drinking well. No fever.    Abdominal Pain Associated symptoms: diarrhea and dysuria   Diarrhea Associated symptoms: abdominal pain   Dysuria Associated symptoms: abdominal pain     Past Medical History:  Diagnosis Date   ADHD    Anxiety    Bipolar 1 disorder (HCC)    Bladder spasms    BMI 27.0-27.9,adult    Chlamydia infection 06/2008   Chronic eczematous otitis externa of both ears 04/12/2023   Chronic migraine without aura without status migrainosus, not intractable 08/15/2021   Dysfunction of both eustachian tubes 04/12/2023   Gastritis    GERD (gastroesophageal reflux disease)    Goiter 04/11/2021   IBS (irritable bowel syndrome)    Intermenstrual spotting    Migraine    Palpitations 09/25/2023   Postural lightheadedness 09/25/2023   Pre-eclampsia 08/16/2014   Referred ear pain, bilateral 04/12/2023   Second degree AV block, Mobitz type I, asymptomatic during sleeping hours on Zio patch 09/25/2023   Severe anxiety with panic    Stomach ulcer    Tinnitus of both ears 04/12/2023   UTI (urinary tract infection)    used to get bad UTIs    Patient Active Problem List   Diagnosis Date Noted   Palpitations 09/25/2023   Second degree AV block, Mobitz  type I, asymptomatic during sleeping hours on Zio patch 09/25/2023   Postural lightheadedness 09/25/2023   Bladder spasms    BMI 27.0-27.9,adult    Gastritis    GERD (gastroesophageal reflux disease)    Intermenstrual spotting    Migraine    Severe anxiety with panic    Stomach ulcer    UTI (urinary tract infection)    Chronic eczematous otitis externa of both ears 04/12/2023   Dysfunction of both eustachian tubes 04/12/2023   Referred ear pain, bilateral 04/12/2023   Tinnitus of both ears 04/12/2023   Chronic migraine without aura without status migrainosus, not intractable 08/15/2021   Goiter 04/11/2021   IBS (irritable bowel syndrome)    Bipolar 1 disorder (HCC)    Anxiety    ADHD    Pre-eclampsia 08/16/2014   Chlamydia infection 06/2008    Past Surgical History:  Procedure Laterality Date   WISDOM TOOTH EXTRACTION      OB History     Gravida  1   Para      Term      Preterm      AB      Living         SAB      IAB      Ectopic      Multiple      Live Births  Home Medications    Prior to Admission medications   Medication Sig Start Date End Date Taking? Authorizing Provider  pantoprazole  (PROTONIX ) 40 MG tablet Take 1 tablet (40 mg total) by mouth daily. 08/04/24  Yes Laurie Shane A, FNP  promethazine  (PHENERGAN ) 25 MG tablet Take 1 tablet (25 mg total) by mouth every 8 (eight) hours as needed for nausea or vomiting. 08/04/24  Yes Laurie Mole A, FNP  sucralfate  (CARAFATE ) 1 g tablet Take 1 tablet (1 g total) by mouth 4 (four) times daily -  with meals and at bedtime for 14 days. 08/04/24 08/18/24 Yes Laurie Metsker A, FNP  albuterol  (VENTOLIN  HFA) 108 (90 Base) MCG/ACT inhaler Inhale 2 puffs into the lungs every 4 (four) hours as needed for wheezing or shortness of breath. 01/23/24   Laurie Domino, FNP  B Complex Vitamins (B COMPLEX PO) Take by mouth as needed.    [provider]  baclofen  (LIORESAL ) 10 MG tablet Take 1 tablet (10  mg total) by mouth 3 (three) times daily as needed for muscle spasms. 09/26/23   Laurie Juliene SAUNDERS, DO  clonazePAM (KLONOPIN) 0.5 MG tablet Take 0.5 mg by mouth daily as needed for anxiety. 09/03/23   [provider]  famotidine  (PEPCID ) 20 MG tablet Take 1 tablet (20 mg total) by mouth 2 (two) times daily. 07/02/24   Guzman, Liuba, MD  fluticasone  (FLONASE ) 50 MCG/ACT nasal spray Place 2 sprays into both nostrils daily. 07/02/24   Guzman, Liuba, MD  fluticasone  (FLOVENT  HFA) 110 MCG/ACT inhaler Inhale 2 puffs into the lungs every 12 (twelve) hours. 05/21/24   Laurie Jennet LABOR, MD  fluticasone  (FLOVENT  HFA) 44 MCG/ACT inhaler Inhale two puffs with spacer twice daily to prevent cough or wheeze. Can use two puffs every four to six hours with Albuterol  during flare-up. Rinse, gargle, and spit after use. 03/20/24   Kozlow, Laurie PARAS, MD  levocetirizine (XYZAL  ALLERGY 24HR) 5 MG tablet Take 1 tablet (5 mg total) by mouth every evening. 07/02/24   Guzman, Liuba, MD  MAGNESIUM GLYCINATE PO Take by mouth as needed.    [provider]  predniSONE  (DELTASONE ) 20 MG tablet Take 1 tablet (20 mg total) by mouth daily with breakfast. 05/21/24   Olalere, Laurie A, MD  rizatriptan (MAXALT) 10 MG tablet TAKE 1 TABLET BY MOUTH AS NEEDED FOR HEADACHE. MAY REPEAT IN 2 HOURS 11/18/23   [provider]  Spacer/Aero-Holding Chambers (COMPACT SPACE CHAMBER) DEVI Use with the albuterol  inhaler 01/23/24   Laurie Domino, FNP  SYMBICORT  160-4.5 MCG/ACT inhaler Inhale two puffs twice daily to prevent cough or wheeze.  Rinse, gargle, and spit after use. 04/17/24   Kozlow, Laurie J, MD  VITAMIN D PO Take by mouth as needed.    [provider]    Family History Family History  Problem Relation Age of Onset   Irritable bowel syndrome Mother    Irritable bowel syndrome Father    Ulcers Father        Stomach Ulcers   Migraines Paternal Aunt    Migraines Paternal Aunt    COPD Paternal Aunt     Allergic rhinitis Maternal Grandmother    COPD Paternal Grandmother    Migraines Paternal Grandmother     Social History Social History   Tobacco Use   Smoking status: Some Days    Types: Cigarettes   Smokeless tobacco: Never   Tobacco comments:    Social smoker; last smoked in August 2021  Vaping Use  Vaping status: Never Used  Substance Use Topics   Alcohol use: Yes   Drug use: Yes    Types: Marijuana     Allergies   Molds & smuts   Review of Systems Review of Systems  Gastrointestinal:  Positive for abdominal pain and diarrhea.  Genitourinary:  Positive for dysuria.     Physical Exam Triage Vital Signs ED Triage Vitals  Encounter Vitals Group     BP 08/04/24 0852 106/70     Girls Systolic BP Percentile --      Girls Diastolic BP Percentile --      Boys Systolic BP Percentile --      Boys Diastolic BP Percentile --      Pulse Rate 08/04/24 0852 66     Resp 08/04/24 0852 20     Temp 08/04/24 0852 97.9 F (36.6 C)     Temp Source 08/04/24 0852 Oral     SpO2 08/04/24 0852 98 %     Weight --      Height --      Head Circumference --      Peak Flow --      Pain Score 08/04/24 0854 8     Pain Loc --      Pain Education --      Exclude from Growth Chart --    No data found.  Updated Vital Signs BP 106/70 (BP Location: Right Arm)   Pulse 66   Temp 97.9 F (36.6 C) (Oral)   Resp 20   LMP 07/11/2024   SpO2 98%   Visual Acuity Right Eye Distance:   Left Eye Distance:   Bilateral Distance:    Right Eye Near:   Left Eye Near:    Bilateral Near:     Physical Exam Vitals and nursing note reviewed.  Constitutional:      General: She is not in acute distress.    Appearance: Normal appearance. She is not ill-appearing, toxic-appearing or diaphoretic.  Pulmonary:     Effort: Pulmonary effort is normal.  Abdominal:     Tenderness: There is generalized abdominal tenderness and tenderness in the epigastric area.  Musculoskeletal:        General:  Normal range of motion.  Neurological:     Mental Status: She is alert.  Psychiatric:        Mood and Affect: Mood normal.      UC Treatments / Results  Labs (all labs ordered are listed, but only abnormal results are displayed) Labs Reviewed  POCT URINE DIPSTICK - Abnormal; Notable for the following components:      Result Value   Clarity, UA cloudy (*)    Blood, UA small (*)    All other components within normal limits    EKG   Radiology No results found.  Procedures Procedures (including critical care time)  Medications Ordered in UC Medications  alum & mag hydroxide-simeth (MAALOX/MYLANTA) 200-200-20 MG/5ML suspension 30 mL (30 mLs Oral Given 08/04/24 0928)  lidocaine  (XYLOCAINE ) 2 % viscous mouth solution 15 mL (15 mLs Mouth/Throat Given 08/04/24 9071)    Initial Impression / Assessment and Plan / UC Course  I have reviewed the triage vital signs and the nursing notes.  Pertinent labs & imaging results that were available during my care of the patient were reviewed by me and considered in my medical decision making (see chart for details).     Abdominal pain- most likely gastritis. She did get some relief with the GI  cocktail.  She is not  currently taking the famotidine .  Will start her on Protonix  daily.  Also starting on Carafate  3 times [facility] day before meals and at bedtime. Phenergan  for nausea as needed since the Zofran  was not helping. Patient has [facility] specialist that she can follow-up with if needed.  Commend follow-up with them for continued issues. Final Clinical Impressions(s) / UC Diagnoses   Final diagnoses:  Dysuria  Acute gastritis without hemorrhage, unspecified gastritis type     Discharge Instructions      Treating you for gastritis Medications as prescribed.  Follow up with GI specialist for continued issues.  You can take Mylanta/Maalox OTC as needed in addition.       ED Prescriptions     Medication Sig Dispense Auth. Provider    pantoprazole  (PROTONIX ) 40 MG tablet Take 1 tablet (40 mg total) by mouth daily. 14 tablet Hollin Crewe A, FNP   sucralfate  (CARAFATE ) 1 g tablet Take 1 tablet (1 g total) by mouth 4 (four) times daily -  with meals and at bedtime for 14 days. 56 tablet Lace Chenevert A, FNP   promethazine  (PHENERGAN ) 25 MG tablet Take 1 tablet (25 mg total) by mouth every 8 (eight) hours as needed for nausea or vomiting. 30 tablet Adah Wilbert LABOR, FNP      PDMP not reviewed this encounter.   Adah Wilbert LABOR, FNP 08/04/24 1030

## 2024-08-04 NOTE — Discharge Instructions (Signed)
 Treating you for gastritis Medications as prescribed.  Follow up with GI specialist for continued issues.  You can take Mylanta/Maalox OTC as needed in addition.

## 2024-08-04 NOTE — ED Triage Notes (Signed)
 Since Friday morning she's had stomach pain, burning, diarrhea, with bubby stool, has zofran  but states it's not workng well. Patient feels weak, has chronic stomach issues, takes famotidine , has hx of GERD, gastritis, reflux, past hospitalization for stomach ulcer. Patient experiencing frequency and burning with urination but states that this is an ongoing issue. Patient reports she's eating and drinking well.

## 2024-08-27 ENCOUNTER — Ambulatory Visit: Payer: MEDICAID | Admitting: Pulmonary Disease

## 2024-08-27 ENCOUNTER — Telehealth: Payer: MEDICAID | Admitting: Pulmonary Disease

## 2024-08-27 ENCOUNTER — Encounter: Payer: Self-pay | Admitting: Pulmonary Disease

## 2024-08-27 DIAGNOSIS — R053 Chronic cough: Secondary | ICD-10-CM

## 2024-08-27 DIAGNOSIS — J393 Upper respiratory tract hypersensitivity reaction, site unspecified: Secondary | ICD-10-CM

## 2024-08-27 MED ORDER — LEVALBUTEROL TARTRATE 45 MCG/ACT IN AERO
2.0000 | INHALATION_SPRAY | RESPIRATORY_TRACT | 2 refills | Status: DC | PRN
Start: 2024-08-27 — End: 2024-10-29

## 2024-08-27 NOTE — Progress Notes (Signed)
 Virtual Visit via Video Note  I connected with Laurie Guzman on 08/27/24 at  1:30 PM EDT by a video enabled telemedicine application and verified that I am speaking with the correct person using two identifiers.  Location: Patient: At home Provider: [address] in the office   I discussed the limitations of evaluation and management by telemedicine and the availability of in person appointments. The patient expressed understanding and agreed to proceed.  History of Present Illness: Follow-up for chronic cough  Overall symptoms better Following up with ENT-surgeon Dr. Soldotova - Making some dietary changes, optimizing GERD treatment-on PPI -Has an appointment in about a week to follow-up - Xyzal , nasal steroid  Nasal stuffiness and congestion being managed  Not having as much breathing issues - Does use albuterol  but albuterol  does cause some palpitations   Observations/Objective: She looks well Does not appear to be in distress   Pulmonary function test results discussed with the patient Showing normal spirometry with significant bronchodilator response, no restriction, normal diffusing capacity - There was 12% improvement in FEV1 with albuterol  use  Assessment and Plan: Airway hyperresponsiveness  Chronic cough - Likely related to GERD, postnasal drip - This is being addressed and she is feeling better  Airway hyperresponsiveness - Albuterol  as needed If needing albuterol  frequently, continuing with an inhaled steroid will be of benefit  Follow Up Instructions: Follow-up in about 6 months  Sooner if she is not doing well    I discussed the assessment and treatment plan with the patient. The patient was provided an opportunity to ask questions and all were answered. The patient agreed with the plan and demonstrated an understanding of the instructions.   The patient was advised to call back or seek an in-person evaluation if the symptoms worsen or if the  condition fails to improve as anticipated.  I provided 22 minutes of non-face-to-face time during this encounter.   Jennet DELENA Epley, MD

## 2024-08-27 NOTE — Patient Instructions (Signed)
 Will place a prescription for Xopenex   If not covered or works out too expensive, we will refill the albuterol   Make sure you follow-up with ENT  Call us  with significant concerns  Tentative follow-up in about 6 months

## 2024-09-03 ENCOUNTER — Encounter (INDEPENDENT_AMBULATORY_CARE_PROVIDER_SITE_OTHER): Payer: Self-pay | Admitting: Otolaryngology

## 2024-09-03 ENCOUNTER — Ambulatory Visit (INDEPENDENT_AMBULATORY_CARE_PROVIDER_SITE_OTHER): Payer: MEDICAID | Admitting: Otolaryngology

## 2024-09-03 VITALS — BP 104/73 | HR 64 | Temp 98.0°F | Ht 65.5 in | Wt 176.0 lb

## 2024-09-03 DIAGNOSIS — R1013 Epigastric pain: Secondary | ICD-10-CM

## 2024-09-03 DIAGNOSIS — J3489 Other specified disorders of nose and nasal sinuses: Secondary | ICD-10-CM

## 2024-09-03 DIAGNOSIS — J343 Hypertrophy of nasal turbinates: Secondary | ICD-10-CM

## 2024-09-03 DIAGNOSIS — R09A2 Foreign body sensation, throat: Secondary | ICD-10-CM

## 2024-09-03 DIAGNOSIS — K219 Gastro-esophageal reflux disease without esophagitis: Secondary | ICD-10-CM

## 2024-09-03 DIAGNOSIS — F17210 Nicotine dependence, cigarettes, uncomplicated: Secondary | ICD-10-CM

## 2024-09-03 DIAGNOSIS — J342 Deviated nasal septum: Secondary | ICD-10-CM | POA: Diagnosis not present

## 2024-09-03 DIAGNOSIS — R0981 Nasal congestion: Secondary | ICD-10-CM

## 2024-09-03 MED ORDER — FLUTICASONE PROPIONATE HFA 44 MCG/ACT IN AERO: INHALATION_SPRAY | RESPIRATORY_TRACT | 5 refills | Status: DC

## 2024-09-03 MED ORDER — LEVOCETIRIZINE DIHYDROCHLORIDE 5 MG PO TABS: 5.0000 mg | ORAL_TABLET | Freq: Every evening | ORAL | 3 refills | Status: DC

## 2024-09-03 NOTE — Progress Notes (Signed)
 ENT Progress Note:   Update 09/03/2024  Discussed the use of AI scribe software for clinical note transcription with the patient, who gave verbal consent to proceed.  History of Present Illness Laurie Guzman is a 33 year old female with a history of IBS and panic attacks who presents with throat discomfort and stomach issues as well as chronic nasal congestion and post-nasal drainage  She experiences throat discomfort characterized by a sensation of a lump in her throat, which has led to panic attacks. She coughs up unusual pieces of phlegm and feels like something is stuck in her throat. She uses Klonopin during panic attacks. Nasacort has been effective for postnasal drip, but she prefers to avoid medication unless necessary. Nasal saline rinses help alleviate thick nasal secretions, which sometimes make it hard to breathe.   She reports experiencing acid reflux and has been using a supplement reflux gourmet that helps alleviate the symptoms. However, she has been experiencing severe stomach problems, described as stabbing and twisting pain in upper abdomen, which have led to hospital and urgent care visits twice. She mentions being told it might be gastritis, but no thorough investigations were conducted, only urine tests. She has a history of IBS. She has been on famotidine  multiple times but did not notice any significant improvement. She has attempted dietary changes, including reducing bread intake, but is unsure of its impact on her reflux.   Records Reviewed:  Initial Evaluation  Reason for Consult: chronic cough    HPI: Discussed the use of AI scribe software for clinical note transcription with the patient, who gave verbal consent to proceed.  History of Present Illness Laurie Guzman is a 33 year old female who presents with chronic cough and postnasal drip.  She has experienced chronic cough with constant mucus production and frequent postnasal drip since having the flu  around Christmas, approximately seven months ago. The mucus feels like 'something in the back of my throat' that she tries to cough out, resulting in a 'jello spit' consistency, but the cough is mostly dry. The cough is persistent, with flare-ups occurring at any time of the day. No improvement with allergy medications. Lozenges and water provide minimal relief. A chest x-ray in the past was normal as part of her workup for chronic cough.  She has a history of gastritis and was previously treated with acid reflux medications such as Dexilant  and Pepcid , which were ineffective. She reports a burning sensation in her throat when bending over, initially treated with acid medications that did not alleviate her symptoms. She stopped these medications and modified her diet, which helped until the flu exacerbated her symptoms again.  She has tried various treatments for her symptoms, including nasal sprays and allergy pills, which were ineffective. She finds some relief with Benadryl , taken at night depending on the severity of her symptoms. Inhalers initially seemed to help but did not provide lasting relief.  She reports developing tonsil stones, which she had not experienced before.      Records Reviewed:  Office Visit with Dr Neda 05/21/24 Has had a cough and congestion since having the flu in December   Inhalers have not helped previously   Has cough, congestion No fevers, no chills   Previously when she had a course of prednisone  it did help but the coughing came back when she was done with the inhaler   Was on Flovent  previously, did not feel it helped-this was Flovent  44 Albuterol  does cause palpitations   Did  not have any underlying breathing problems prior to having the influenza   Does not smoke cigarettes Did smoke marijuana in the past   Does not have underlying lung disease known to her   Cough is occasionally productive sometimes with creamy phlegm sometimes just a dry  cough Sometimes worse in the evenings   Does have a history of anxiety, history of second-degree AV block, GERD, irritable bowel.  History of bipolar disorder, ADHD, history of anxiety with panic disorder   Did have a recent appointment with an allergist-was not found to be allergic to any specific tr    Past Medical History:  Diagnosis Date   ADHD    Anxiety    Bipolar 1 disorder (HCC)    Bladder spasms    BMI 27.0-27.9,adult    Chlamydia infection 06/2008   Chronic eczematous otitis externa of both ears 04/12/2023   Chronic migraine without aura without status migrainosus, not intractable 08/15/2021   Dysfunction of both eustachian tubes 04/12/2023   Gastritis    GERD (gastroesophageal reflux disease)    Goiter 04/11/2021   IBS (irritable bowel syndrome)    Intermenstrual spotting    Migraine    Palpitations 09/25/2023   Postural lightheadedness 09/25/2023   Pre-eclampsia 08/16/2014   Referred ear pain, bilateral 04/12/2023   Second degree AV block, Mobitz type I, asymptomatic during sleeping hours on Zio patch 09/25/2023   Severe anxiety with panic    Stomach ulcer    Tinnitus of both ears 04/12/2023   UTI (urinary tract infection)    used to get bad UTIs    Past Surgical History:  Procedure Laterality Date   WISDOM TOOTH EXTRACTION      Family History  Problem Relation Age of Onset   Irritable bowel syndrome Mother    Irritable bowel syndrome Father    Ulcers Father        Stomach Ulcers   Migraines Paternal Aunt    Migraines Paternal Aunt    COPD Paternal Aunt    Allergic rhinitis Maternal Grandmother    COPD Paternal Grandmother    Migraines Paternal Grandmother     Social History:  reports that she has been smoking cigarettes. She has never used smokeless tobacco. She reports current alcohol use. She reports current drug use. Drug: Marijuana.  Allergies:  Allergies  Allergen Reactions   Molds & Smuts     Medications: I have reviewed the patient's  current medications.  The PMH, PSH, Medications, Allergies, and SH were reviewed and updated.  ROS: Constitutional: Negative for fever, weight loss and weight gain. Cardiovascular: Negative for chest pain and dyspnea on exertion. Respiratory: Is not experiencing shortness of breath at rest. Gastrointestinal: Negative for nausea and vomiting. Neurological: Negative for headaches. Psychiatric: The patient is not nervous/anxious  Last menstrual period 07/11/2024, unknown if currently breastfeeding. There is no height or weight on file to calculate BMI.  PHYSICAL EXAM:  Exam: General: Well-developed, well-nourished Respiratory Respiratory effort: Equal inspiration and expiration without stridor Cardiovascular Peripheral Vascular: Warm extremities with equal color/perfusion Eyes: No nystagmus with equal extraocular motion bilaterally Neuro/Psych/Balance: Patient oriented to person, place, and time; Appropriate mood and affect; Gait is intact with no imbalance; Cranial nerves I-XII are intact Head and Face Inspection: Normocephalic and atraumatic without mass or lesion Palpation: Facial skeleton intact without bony stepoffs Salivary Glands: No mass or tenderness Facial Strength: Facial motility symmetric and full bilaterally ENT Pinna: External ear intact and fully developed External canal: Canal is patent with intact  skin Tympanic Membrane: Clear and mobile External Nose: No scar or anatomic deformity Internal Nose: Septum is deviated to the left with > 90% narrowing of the left nasal passage. No polyp, or purulence. Mucosal edema and erythema present.  Bilateral inferior turbinate hypertrophy.  Lips, Teeth, and gums: Mucosa and teeth intact and viable TMJ: No pain to palpation with full mobility Oral cavity/oropharynx: No erythema or exudate, no lesions present Nasopharynx: No mass or lesion with intact mucosa Hypopharynx: Intact mucosa without pooling of  secretions Larynx Glottic: Full true vocal cord mobility without lesion or mass Supraglottic: Normal appearing epiglottis and AE folds Interarytenoid Space: Moderate pachydermia&edema Subglottic Space: Patent without lesion or edema Neck Neck and Trachea: Midline trachea without mass or lesion Thyroid: No mass or nodularity Lymphatics: No lymphadenopathy  Procedure: Preoperative diagnosis: globus sensation   Postoperative diagnosis:   Same + GERD LPR  Procedure: Flexible fiberoptic laryngoscopy  Surgeon: Elena Larry, MD  Anesthesia: Topical lidocaine  and Afrin Complications: None Condition is stable throughout exam  Indications and consent:  The patient presents to the clinic with above symptoms. Indirect laryngoscopy view was incomplete. Thus it was recommended that they undergo a flexible fiberoptic laryngoscopy. All of the risks, benefits, and potential complications were reviewed with the patient preoperatively and verbal informed consent was obtained.  Procedure: The patient was seated upright in the clinic. Topical lidocaine  and Afrin were applied to the nasal cavity. After adequate anesthesia had occurred, I then proceeded to pass the flexible telescope into the nasal cavity. The nasal cavity was patent without rhinorrhea or polyp. The nasopharynx was also patent without mass or lesion. The base of tongue was visualized and was normal. There were no signs of pooling of secretions in the piriform sinuses. The true vocal folds were mobile bilaterally. There were no signs of glottic or supraglottic mucosal lesion or mass. There was moderate interarytenoid pachydermia and post cricoid edema. The telescope was then slowly withdrawn and the patient tolerated the procedure throughout.    PROCEDURE NOTE: nasal endoscopy  Preoperative diagnosis: chronic nasal congestion symptoms  Postoperative diagnosis: same  Procedure: Diagnostic nasal endoscopy (68768)  Surgeon: Elena Larry, M.D.  Anesthesia: Topical lidocaine  and Afrin  H&P REVIEW: The patient's history and physical were reviewed today prior to procedure. All medications were reviewed and updated as well. Complications: None Condition is stable throughout exam Indications and consent: The patient presents with symptoms of chronic sinusitis not responding to previous therapies. All the risks, benefits, and potential complications were reviewed with the patient preoperatively and informed consent was obtained. The time out was completed with confirmation of the correct procedure.   Procedure: The patient was seated upright in the clinic. Topical lidocaine  and Afrin were applied to the nasal cavity. After adequate anesthesia had occurred, the rigid nasal endoscope was passed into the nasal cavity. The nasal mucosa, turbinates, septum, and sinus drainage pathways were visualized bilaterally. This revealed no purulence or significant secretions that might be cultured. There were no polyps or sites of significant inflammation. The mucosa was intact and there was no crusting present. The scope was then slowly withdrawn and the patient tolerated the procedure well. There were no complications or blood loss.   Studies Reviewed: CXR 05/21/24 FINDINGS: Normal heart, mediastinum and hila.   Clear lungs.  No pleural effusion or pneumothorax.   Skeletal structures are unremarkable.   IMPRESSION: Normal chest radiographs.  Assessment/Plan: No diagnosis found.   Assessment and Plan Assessment & Plan Chronic cough Persistent cough  post-flu with mucus, postnasal drip, and throat clearing. Hx of GERD. Not on medications. Ineffective treatment with inhalers and acid reflux medications. Possible neurogenic cough due to hypersensitive cough reflex vs cough due to post-nasal drainage or GERD LPR - Prescribed Pepcid  20 mg morning and night. - Recommended Reflux Gourmet after meals. - Consider superior laryngeal nerve  block if cough persists when she returns.  Chronic nasal congestion and post-nasal drip suspected environmental allergies Frequent postnasal drip contributing to cough and throat clearing. Ineffective nasal sprays and allergy pills. Benadryl  provides some relief. - Prescribed Xyzal  at night. - Recommended continued use of Nasacort nasal spray morning and night.  Gastroesophageal reflux disease (GERD) LPR GERD with throat burning and mucus production. Ineffective acid reflux medications.  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  - Discussed septoplasty as a potential intervention to straighten the septum and improve nasal passage. - Risks and benefits of surgery discussed and she would like to proceed - Schedule septoplasty under general anesthesia if she decides to proceed. Surgery scheduler to contact her with dates.  Chronic nasal congestion and environmental allergies  Symptoms include postnasal drip and sensation of a lump in the throat. Nasacort effective but she prefers to minimize medication use. - Continue Nasacort nasal spray and allergy medication. Refilled today - Consider allergy shots if allergist deems her a candidate. Already established with Allergy - Continue nasal saline rinses to manage thick postnasal drip. - continue Xyzal  5 mg daily  Abdominal Pain Severe abdominal pain previously diagnosed as gastritis without EGD. Current symptoms not believed to be related to supplement use such as Reflux Gourmet - Refer to  gastroenterology for further evaluation of abdominal pain and potential gastritis.  Globus sensation and throat clearing  Evidence of GERD LPR on flexible scope exam otherwise no mucus noted and upper airway looks patent - management of GERD - offered Pepcid  but she would prefer to avoid medications -  Reflux Gourmet after meals - diet and lifestyle changes to minimize GERD - Refer to [employer] blog for dietary and lifestyle modifications/reflux cook book  Thank you for allowing me to participate in the care of this patient. Please do not hesitate to contact me with any questions or concerns.   Elena Larry, MD Otolaryngology [facility] Phone: [phone] Fax: [phone]    09/03/2024, 11:33 AM

## 2024-09-05 ENCOUNTER — Ambulatory Visit (INDEPENDENT_AMBULATORY_CARE_PROVIDER_SITE_OTHER): Payer: MEDICAID | Admitting: Otolaryngology

## 2024-09-10 ENCOUNTER — Encounter (HOSPITAL_BASED_OUTPATIENT_CLINIC_OR_DEPARTMENT_OTHER): Payer: Self-pay

## 2024-09-10 ENCOUNTER — Other Ambulatory Visit (HOSPITAL_BASED_OUTPATIENT_CLINIC_OR_DEPARTMENT_OTHER): Payer: Self-pay

## 2024-09-10 ENCOUNTER — Ambulatory Visit (HOSPITAL_BASED_OUTPATIENT_CLINIC_OR_DEPARTMENT_OTHER)
Admission: RE | Admit: 2024-09-10 | Discharge: 2024-09-10 | Disposition: A | Discharge status: Home / Self Care | Payer: MEDICAID | Attending: Family Medicine | Admitting: Family Medicine

## 2024-09-10 VITALS — BP 110/3 | HR 55 | Temp 98.3°F | Resp 20

## 2024-09-10 DIAGNOSIS — G43819 Other migraine, intractable, without status migrainosus: Secondary | ICD-10-CM

## 2024-09-10 DIAGNOSIS — R112 Nausea with vomiting, unspecified: Secondary | ICD-10-CM

## 2024-09-10 DIAGNOSIS — J3489 Other specified disorders of nose and nasal sinuses: Secondary | ICD-10-CM | POA: Diagnosis not present

## 2024-09-10 MED ORDER — KETOROLAC TROMETHAMINE 30 MG/ML IJ SOLN
30.0000 mg | Freq: Once | INTRAMUSCULAR | Status: AC
  Administered 2024-09-10: 30 mg via INTRAMUSCULAR

## 2024-09-10 MED ORDER — ONDANSETRON HCL 4 MG/2ML IJ SOLN
4.0000 mg | Freq: Once | INTRAMUSCULAR | Status: AC
  Administered 2024-09-10: 4 mg via INTRAMUSCULAR

## 2024-09-10 MED ORDER — ONDANSETRON 4 MG PO TBDP
4.0000 mg | ORAL_TABLET | Freq: Three times a day (TID) | ORAL | 0 refills | Status: DC | PRN
  Filled 2024-09-10: qty 20, 7d supply, fill #0

## 2024-09-10 MED ORDER — KETOROLAC TROMETHAMINE 10 MG PO TABS
10.0000 mg | ORAL_TABLET | Freq: Four times a day (QID) | ORAL | 0 refills | Status: DC | PRN
  Filled 2024-09-10: qty 20, 5d supply, fill #0

## 2024-09-10 NOTE — Discharge Instructions (Addendum)
 Migraine with nausea vomiting and sinus pressure: Ketorolac  30 mg injection now (this is an NSAID).  Ondansetron  4 mg injection now.  Ketorolac  10 mg every 6 hours with food as needed for headache.  Ondansetron  4 mg every 8 hours melts on tongue as needed for nausea and vomiting.  Follow-up with neurology as needed.  Follow-up here as needed.

## 2024-09-10 NOTE — ED Provider Notes (Signed)
 [name]    CSN: [id number] Arrival date & time: 09/10/24  0845      History   Chief Complaint Chief Complaint  Patient presents with   Migraine    HPI Laurie Guzman is a 33 y.o. female.   33 year old female with long history of migraines.  Early on 09/08/2024, she had a severe migraine headache with significant change in vision in the right eye.  The migraine lasted for several hours.  She is followed by neurology and has not had any help with the triptans or many other acute treatments for migraines.  She averages 2 or 3 migraines a year and does not want to take preventative therapy.  She did try a butalbital acetaminophen  caffeine product but it was not helpful.  She had significant nausea and vomiting.  She had 1 promethazine  at home and it helped her nausea some.  She is out of medication for nausea.  The migraine is persisting and is a dull throbbing headache.  She also has some sinus pressure and pain but no nasal congestion.  She denies fever, cough, diarrhea, constipation.   Migraine Associated symptoms include headaches. Pertinent negatives include no chest pain, no abdominal pain and no shortness of breath.    Past Medical History:  Diagnosis Date   ADHD    Anxiety    Bipolar 1 disorder (HCC)    Bladder spasms    BMI 27.0-27.9,adult    Chlamydia infection 06/2008   Chronic eczematous otitis externa of both ears 04/12/2023   Chronic migraine without aura without status migrainosus, not intractable 08/15/2021   Dysfunction of both eustachian tubes 04/12/2023   Gastritis    GERD (gastroesophageal reflux disease)    Goiter 04/11/2021   IBS (irritable bowel syndrome)    Intermenstrual spotting    Migraine    Palpitations 09/25/2023   Postural lightheadedness 09/25/2023   Pre-eclampsia 08/16/2014   Referred ear pain, bilateral 04/12/2023   Second degree AV block, Mobitz type I, asymptomatic during sleeping hours on Zio patch 09/25/2023   Severe  anxiety with panic    Stomach ulcer    Tinnitus of both ears 04/12/2023   UTI (urinary tract infection)    used to get bad UTIs    Patient Active Problem List   Diagnosis Date Noted   Palpitations 09/25/2023   Second degree AV block, Mobitz type I, asymptomatic during sleeping hours on Zio patch 09/25/2023   Postural lightheadedness 09/25/2023   Bladder spasms    BMI 27.0-27.9,adult    Gastritis    GERD (gastroesophageal reflux disease)    Intermenstrual spotting    Migraine    Severe anxiety with panic    Stomach ulcer    UTI (urinary tract infection)    Chronic eczematous otitis externa of both ears 04/12/2023   Dysfunction of both eustachian tubes 04/12/2023   Referred ear pain, bilateral 04/12/2023   Tinnitus of both ears 04/12/2023   Chronic migraine without aura without status migrainosus, not intractable 08/15/2021   Goiter 04/11/2021   IBS (irritable bowel syndrome)    Bipolar 1 disorder (HCC)    Anxiety    ADHD    Pre-eclampsia 08/16/2014   Chlamydia infection 06/2008    Past Surgical History:  Procedure Laterality Date   WISDOM TOOTH EXTRACTION      OB History     Gravida  1   Para      Term      Preterm      AB  Living         SAB      IAB      Ectopic      Multiple      Live Births               Home Medications    Prior to Admission medications   Medication Sig Start Date End Date Taking? Authorizing Provider  ketorolac  (TORADOL ) 10 MG tablet Take 1 tablet (10 mg total) by mouth every 6 (six) hours as needed. 09/10/24  Yes Ival Domino, FNP  ondansetron  (ZOFRAN -ODT) 4 MG disintegrating tablet Take 1 tablet (4 mg total) by mouth every 8 (eight) hours as needed for nausea or vomiting. 09/10/24  Yes Ival Domino, FNP  B Complex Vitamins (B COMPLEX PO) Take by mouth as needed.    [provider]  baclofen  (LIORESAL ) 10 MG tablet Take 1 tablet (10 mg total) by mouth 3 (three) times daily as needed for muscle spasms.  09/26/23   Skeet Juliene SAUNDERS, DO  clonazePAM (KLONOPIN) 0.5 MG tablet Take 0.5 mg by mouth daily as needed for anxiety. 09/03/23   [provider]  famotidine  (PEPCID ) 20 MG tablet Take 1 tablet (20 mg total) by mouth 2 (two) times daily. 07/02/24   Soldatova, Liuba, MD  fluticasone  (FLONASE ) 50 MCG/ACT nasal spray Place 2 sprays into both nostrils daily. 07/02/24   Soldatova, Liuba, MD  fluticasone  (FLOVENT  HFA) 110 MCG/ACT inhaler Inhale 2 puffs into the lungs every 12 (twelve) hours. 05/21/24   Neda Jennet LABOR, MD  fluticasone  (FLOVENT  HFA) 44 MCG/ACT inhaler Inhale two puffs with spacer twice daily to prevent cough or wheeze. Can use two puffs every four to six hours with Albuterol  during flare-up. Rinse, gargle, and spit after use. 09/03/24   Soldatova, Liuba, MD  levalbuterol  (XOPENEX  HFA) 45 MCG/ACT inhaler Inhale 2 puffs into the lungs every 4 (four) hours as needed for wheezing. 08/27/24 08/27/25  Neda Jennet LABOR, MD  levocetirizine (XYZAL  ALLERGY 24HR) 5 MG tablet Take 1 tablet (5 mg total) by mouth every evening. 09/03/24   Okey Burns, MD  MAGNESIUM GLYCINATE PO Take by mouth as needed.    [provider]  pantoprazole  (PROTONIX ) 40 MG tablet Take 1 tablet (40 mg total) by mouth daily. 08/04/24   Adah Wilbert LABOR, FNP  promethazine  (PHENERGAN ) 25 MG tablet Take 1 tablet (25 mg total) by mouth every 8 (eight) hours as needed for nausea or vomiting. 08/04/24   Adah Wilbert A, FNP  rizatriptan (MAXALT) 10 MG tablet TAKE 1 TABLET BY MOUTH AS NEEDED FOR HEADACHE. MAY REPEAT IN 2 HOURS 11/18/23   [provider]  Spacer/Aero-Holding Chambers (COMPACT SPACE CHAMBER) DEVI Use with the albuterol  inhaler 01/23/24   Ival Domino, FNP  sucralfate  (CARAFATE ) 1 g tablet Take 1 tablet (1 g total) by mouth 4 (four) times daily -  with meals and at bedtime for 14 days. 08/04/24 09/03/24  Adah Wilbert LABOR, FNP  SYMBICORT  160-4.5 MCG/ACT inhaler Inhale two puffs twice daily to prevent cough or  wheeze.  Rinse, gargle, and spit after use. 04/17/24   Kozlow, Eric J, MD  VITAMIN D PO Take by mouth as needed.    [provider]    Family History Family History  Problem Relation Age of Onset   Irritable bowel syndrome Mother    Irritable bowel syndrome Father    Ulcers Father        Stomach Ulcers   Migraines Paternal Aunt  Migraines Paternal Aunt    COPD Paternal Aunt    Allergic rhinitis Maternal Grandmother    COPD Paternal Grandmother    Migraines Paternal Grandmother     Social History Social History   Tobacco Use   Smoking status: Some Days    Types: Cigarettes   Smokeless tobacco: Never   Tobacco comments:    Social smoker; last smoked in August 2021  Vaping Use   Vaping status: Never Used  Substance Use Topics   Alcohol use: Yes   Drug use: Yes    Types: Marijuana     Allergies   Molds & smuts   Review of Systems Review of Systems  Constitutional:  Negative for chills and fever.  HENT:  Negative for ear pain and sore throat.   Eyes:  Negative for pain and visual disturbance.  Respiratory:  Negative for cough and shortness of breath.   Cardiovascular:  Negative for chest pain and palpitations.  Gastrointestinal:  Positive for nausea and vomiting. Negative for abdominal pain, constipation and diarrhea.  Genitourinary:  Negative for dysuria and hematuria.  Musculoskeletal:  Negative for arthralgias and back pain.  Skin:  Negative for color change and rash.  Neurological:  Positive for headaches. Negative for seizures and syncope.  All other systems reviewed and are negative.    Physical Exam Triage Vital Signs ED Triage Vitals  Encounter Vitals Group     BP 09/10/24 0903 (!) 110/3     Girls Systolic BP Percentile --      Girls Diastolic BP Percentile --      Boys Systolic BP Percentile --      Boys Diastolic BP Percentile --      Pulse Rate 09/10/24 0903 (!) 55     Resp 09/10/24 0903 20     Temp 09/10/24 0903 98.3 F (36.8 C)      Temp Source 09/10/24 0903 Oral     SpO2 09/10/24 0903 96 %     Weight --      Height --      Head Circumference --      Peak Flow --      Pain Score 09/10/24 0904 4     Pain Loc --      Pain Education --      Exclude from Growth Chart --    No data found.  Updated Vital Signs BP (!) 110/3 (BP Location: Right Arm)   Pulse (!) 55   Temp 98.3 F (36.8 C) (Oral)   Resp 20   LMP 09/06/2024   SpO2 96%   Visual Acuity Right Eye Distance:   Left Eye Distance:   Bilateral Distance:    Right Eye Near:   Left Eye Near:    Bilateral Near:     Physical Exam Vitals and nursing note reviewed.  Constitutional:      General: She is not in acute distress.    Appearance: She is well-developed. She is not ill-appearing or toxic-appearing.  HENT:     Head: Normocephalic and atraumatic.     Right Ear: Hearing, tympanic membrane, ear canal and external ear normal.     Left Ear: Hearing, tympanic membrane, ear canal and external ear normal.     Nose: No congestion or rhinorrhea.     Right Sinus: Maxillary sinus tenderness and frontal sinus tenderness present.     Left Sinus: Maxillary sinus tenderness and frontal sinus tenderness present.     Comments: Mild sinus pressure or tenderness but  no nasal discharge    Mouth/Throat:     Lips: Pink.     Mouth: Mucous membranes are moist.     Pharynx: Uvula midline. No oropharyngeal exudate or posterior oropharyngeal erythema.     Tonsils: No tonsillar exudate.  Eyes:     Conjunctiva/sclera: Conjunctivae normal.     Pupils: Pupils are equal, round, and reactive to light.  Cardiovascular:     Rate and Rhythm: Normal rate and regular rhythm.     Heart sounds: S1 normal and S2 normal. No murmur heard. Pulmonary:     Effort: Pulmonary effort is normal. No respiratory distress.     Breath sounds: Normal breath sounds. No decreased breath sounds, wheezing, rhonchi or rales.  Abdominal:     General: Bowel sounds are normal.     Palpations:  Abdomen is soft.     Tenderness: There is no abdominal tenderness.  Musculoskeletal:        General: No swelling.     Cervical back: Neck supple. No edema, erythema, signs of trauma, rigidity, torticollis or crepitus. Pain with movement and muscular tenderness present. No spinous process tenderness. Normal range of motion.  Lymphadenopathy:     Head:     Right side of head: No submental, submandibular, tonsillar, preauricular or posterior auricular adenopathy.     Left side of head: No submental, submandibular, tonsillar, preauricular or posterior auricular adenopathy.     Cervical: No cervical adenopathy.     Right cervical: No superficial cervical adenopathy.    Left cervical: No superficial cervical adenopathy.  Skin:    General: Skin is warm and dry.     Capillary Refill: Capillary refill takes less than 2 seconds.     Findings: No rash.  Neurological:     Mental Status: She is alert and oriented to person, place, and time.  Psychiatric:        Mood and Affect: Mood normal.      UC Treatments / Results  Labs (all labs ordered are listed, but only abnormal results are displayed) Labs Reviewed - No data to display  EKG   Radiology No results found.  Procedures Procedures (including critical care time)  Medications Ordered in UC Medications  ketorolac  (TORADOL ) 30 MG/ML injection 30 mg (30 mg Intramuscular Given 09/10/24 0924)  ondansetron  (ZOFRAN ) injection 4 mg (4 mg Intramuscular Given 09/10/24 0924)    Initial Impression / Assessment and Plan / UC Course  I have reviewed the triage vital signs and the nursing notes.  Pertinent labs & imaging results that were available during my care of the patient were reviewed by me and considered in my medical decision making (see chart for details).  Plan of Care: Migraine with nausea vomiting and sinus pressure: Ketorolac  30 mg injection now (this is an NSAID).  Ondansetron  4 mg injection now.  Ketorolac  10 mg every 6 hours  with food as needed for headache.  Ondansetron  4 mg every 8 hours melts on tongue as needed for nausea and vomiting.  Follow-up with neurology as needed.  Follow-up here as needed.  I reviewed the plan of care with the patient and/or the patient's guardian.  The patient and/or guardian had time to ask questions and acknowledged that the questions were answered.  I provided instruction on symptoms or reasons to return here or to go to an ER, if symptoms/condition did not improve, worsened or if new symptoms occurred.  Final Clinical Impressions(s) / UC Diagnoses   Final diagnoses:  Sinus pressure  Nausea and vomiting, unspecified vomiting type  Other migraine without status migrainosus, intractable     Discharge Instructions      Migraine with nausea vomiting and sinus pressure: Ketorolac  30 mg injection now (this is an NSAID).  Ondansetron  4 mg injection now.  Ketorolac  10 mg every 6 hours with food as needed for headache.  Ondansetron  4 mg every 8 hours melts on tongue as needed for nausea and vomiting.  Follow-up with neurology as needed.  Follow-up here as needed.     ED Prescriptions     Medication Sig Dispense Auth. Provider   ketorolac  (TORADOL ) 10 MG tablet Take 1 tablet (10 mg total) by mouth every 6 (six) hours as needed. 20 tablet Azhane Eckart, FNP   ondansetron  (ZOFRAN -ODT) 4 MG disintegrating tablet Take 1 tablet (4 mg total) by mouth every 8 (eight) hours as needed for nausea or vomiting. 20 tablet Park Beck, FNP      PDMP not reviewed this encounter.   Ival Domino, FNP 09/10/24 [phone]

## 2024-09-10 NOTE — ED Triage Notes (Signed)
 States hx of migraines. Headache since Monday. States usually needs IM toradol  or imitrex . Also concerned she might have an ear infection contributing to her headache. States headache dull currently but it does get worse. Feeling nausea as well. Sensitive to light when her headache is extreme.  Tolerating now.

## 2024-09-23 ENCOUNTER — Other Ambulatory Visit (HOSPITAL_BASED_OUTPATIENT_CLINIC_OR_DEPARTMENT_OTHER): Payer: Self-pay

## 2024-10-27 ENCOUNTER — Ambulatory Visit (HOSPITAL_BASED_OUTPATIENT_CLINIC_OR_DEPARTMENT_OTHER)
Admission: RE | Admit: 2024-10-27 | Discharge: 2024-10-27 | Disposition: A | Discharge status: Home / Self Care | Payer: MEDICAID | Attending: Family Medicine | Admitting: Family Medicine

## 2024-10-27 ENCOUNTER — Telehealth: Payer: Self-pay | Admitting: Physician Assistant

## 2024-10-27 ENCOUNTER — Ambulatory Visit (HOSPITAL_BASED_OUTPATIENT_CLINIC_OR_DEPARTMENT_OTHER): Payer: Self-pay | Admitting: Family Medicine

## 2024-10-27 ENCOUNTER — Ambulatory Visit (INDEPENDENT_AMBULATORY_CARE_PROVIDER_SITE_OTHER)
Admit: 2024-10-27 | Discharge: 2024-10-27 | Disposition: A | Discharge status: Home / Self Care | Payer: MEDICAID | Admitting: Radiology

## 2024-10-27 ENCOUNTER — Encounter (HOSPITAL_BASED_OUTPATIENT_CLINIC_OR_DEPARTMENT_OTHER): Payer: Self-pay

## 2024-10-27 ENCOUNTER — Other Ambulatory Visit (HOSPITAL_BASED_OUTPATIENT_CLINIC_OR_DEPARTMENT_OTHER): Payer: Self-pay

## 2024-10-27 ENCOUNTER — Other Ambulatory Visit (HOSPITAL_BASED_OUTPATIENT_CLINIC_OR_DEPARTMENT_OTHER): Payer: MEDICAID | Admitting: Radiology

## 2024-10-27 VITALS — BP 104/70 | HR 67 | Temp 98.0°F | Resp 18

## 2024-10-27 DIAGNOSIS — R1010 Upper abdominal pain, unspecified: Secondary | ICD-10-CM

## 2024-10-27 MED ORDER — SUCRALFATE 1 G PO TABS
1.0000 g | ORAL_TABLET | Freq: Three times a day (TID) | ORAL | 0 refills | Status: DC
  Filled 2024-10-27: qty 12, 3d supply, fill #0

## 2024-10-27 NOTE — Telephone Encounter (Signed)
 Patient came in today for her apt and was advised that her apt is on 11/19 at 1:30 pm, she states [facility] sent her the wrong information. Patient started crying and stated she is hurting to a point, she wants to kill herself. I spoke with PA Alan and she asked to advise pt to go to ER.

## 2024-10-27 NOTE — ED Provider Notes (Signed)
 [name]    CSN: [id number] Arrival date & time: 10/27/24  1525      History   Chief Complaint Chief Complaint  Patient presents with   Abdominal Pain    Seen in ER recently. Dx with ruptured ovarian cyst. Pain continues and no pain medication given. Had CT and ultrasound done. - Entered by patient    HPI Laurie Guzman is a 33 y.o. female.   Pt is a 33 year old female that presents with  left upper quadrant abdomen pain, gas since Tuesday. Pt reports when she urinates and eats that   her bladder is in pain and stomach distends. Denies any burning, frequent urination. Pt is urinating and having normal bowel movements. Has taken gas x pill today. She has been eating normally. Has appointment with GI on Wednesday. No fever, N,V,D.     Abdominal Pain   Past Medical History:  Diagnosis Date   ADHD    Anxiety    Bipolar 1 disorder (HCC)    Bladder spasms    BMI 27.0-27.9,adult    Chlamydia infection 06/2008   Chronic eczematous otitis externa of both ears 04/12/2023   Chronic migraine without aura without status migrainosus, not intractable 08/15/2021   Dysfunction of both eustachian tubes 04/12/2023   Gastritis    GERD (gastroesophageal reflux disease)    Goiter 04/11/2021   IBS (irritable bowel syndrome)    Intermenstrual spotting    Migraine    Palpitations 09/25/2023   Postural lightheadedness 09/25/2023   Pre-eclampsia 08/16/2014   Referred ear pain, bilateral 04/12/2023   Second degree AV block, Mobitz type I, asymptomatic during sleeping hours on Zio patch 09/25/2023   Severe anxiety with panic    Stomach ulcer    Tinnitus of both ears 04/12/2023   UTI (urinary tract infection)    used to get bad UTIs    Patient Active Problem List   Diagnosis Date Noted   Palpitations 09/25/2023   Second degree AV block, Mobitz type I, asymptomatic during sleeping hours on Zio patch 09/25/2023   Postural lightheadedness 09/25/2023   Bladder spasms     BMI 27.0-27.9,adult    Gastritis    GERD (gastroesophageal reflux disease)    Intermenstrual spotting    Migraine    Severe anxiety with panic    Stomach ulcer    UTI (urinary tract infection)    Chronic eczematous otitis externa of both ears 04/12/2023   Dysfunction of both eustachian tubes 04/12/2023   Referred ear pain, bilateral 04/12/2023   Tinnitus of both ears 04/12/2023   Chronic migraine without aura without status migrainosus, not intractable 08/15/2021   Goiter 04/11/2021   IBS (irritable bowel syndrome)    Bipolar 1 disorder (HCC)    Anxiety    ADHD    Pre-eclampsia 08/16/2014   Chlamydia infection 06/2008    Past Surgical History:  Procedure Laterality Date   WISDOM TOOTH EXTRACTION      OB History     Gravida  1   Para      Term      Preterm      AB      Living         SAB      IAB      Ectopic      Multiple      Live Births               Home Medications    Prior to Admission medications  Medication Sig Start Date End Date Taking? Authorizing Provider  clonazePAM (KLONOPIN) 0.5 MG tablet Take 0.5 mg by mouth daily as needed for anxiety. 09/03/23  Yes [provider]  L-Glutamine 500 MG CAPS Take by mouth.   Yes [provider]  Simethicone  (GAS-X EXTRA STRENGTH PO) Take by mouth.   Yes [provider]  B Complex Vitamins (B COMPLEX PO) Take by mouth as needed.    [provider]  baclofen  (LIORESAL ) 10 MG tablet Take 1 tablet (10 mg total) by mouth 3 (three) times daily as needed for muscle spasms. 09/26/23   Skeet, Adam R, DO  famotidine  (PEPCID ) 20 MG tablet Take 1 tablet (20 mg total) by mouth 2 (two) times daily. 07/02/24   Soldatova, Liuba, MD  fluticasone  (FLONASE ) 50 MCG/ACT nasal spray Place 2 sprays into both nostrils daily. 07/02/24   Soldatova, Liuba, MD  fluticasone  (FLOVENT  HFA) 110 MCG/ACT inhaler Inhale 2 puffs into the lungs every 12 (twelve) hours. 05/21/24   Neda Jennet LABOR, MD   fluticasone  (FLOVENT  HFA) 44 MCG/ACT inhaler Inhale two puffs with spacer twice daily to prevent cough or wheeze. Can use two puffs every four to six hours with Albuterol  during flare-up. Rinse, gargle, and spit after use. 09/03/24   Soldatova, Liuba, MD  ketorolac  (TORADOL ) 10 MG tablet Take 1 tablet (10 mg total) by mouth every 6 (six) hours as needed. 09/10/24   Ival Domino, FNP  levalbuterol  (XOPENEX  HFA) 45 MCG/ACT inhaler Inhale 2 puffs into the lungs every 4 (four) hours as needed for wheezing. 08/27/24 08/27/25  Neda Jennet LABOR, MD  levocetirizine (XYZAL  ALLERGY 24HR) 5 MG tablet Take 1 tablet (5 mg total) by mouth every evening. 09/03/24   Okey Burns, MD  MAGNESIUM GLYCINATE PO Take by mouth as needed.    [provider]  ondansetron  (ZOFRAN -ODT) 4 MG disintegrating tablet Take 1 tablet (4 mg total) by mouth every 8 (eight) hours as needed for nausea or vomiting. 09/10/24   Ival Domino, FNP  pantoprazole  (PROTONIX ) 40 MG tablet Take 1 tablet (40 mg total) by mouth daily. 08/04/24   Adah Wilbert LABOR, FNP  promethazine  (PHENERGAN ) 25 MG tablet Take 1 tablet (25 mg total) by mouth every 8 (eight) hours as needed for nausea or vomiting. 08/04/24   Adah Wilbert A, FNP  rizatriptan (MAXALT) 10 MG tablet TAKE 1 TABLET BY MOUTH AS NEEDED FOR HEADACHE. MAY REPEAT IN 2 HOURS 11/18/23   [provider]  Spacer/Aero-Holding Chambers (COMPACT SPACE CHAMBER) DEVI Use with the albuterol  inhaler 01/23/24   Ival Domino, FNP  sucralfate  (CARAFATE ) 1 g tablet Take 1 tablet (1 g total) by mouth 4 (four) times daily -  with meals and at bedtime for 3 days. 10/27/24 10/30/24  Adah Wilbert LABOR, FNP  SYMBICORT  160-4.5 MCG/ACT inhaler Inhale two puffs twice daily to prevent cough or wheeze.  Rinse, gargle, and spit after use. 04/17/24   Kozlow, Eric J, MD  VITAMIN D PO Take by mouth as needed.    [provider]    Family History Family History  Problem Relation Age of Onset   Irritable  bowel syndrome Mother    Irritable bowel syndrome Father    Ulcers Father        Stomach Ulcers   Migraines Paternal Aunt    Migraines Paternal Aunt    COPD Paternal Aunt    Allergic rhinitis Maternal Grandmother    COPD Paternal Grandmother    Migraines Paternal Grandmother  Social History Social History   Tobacco Use   Smoking status: Some Days    Types: Cigarettes   Smokeless tobacco: Never   Tobacco comments:    Social smoker; last smoked in August 2021  Vaping Use   Vaping status: Never Used  Substance Use Topics   Alcohol use: Yes   Drug use: Yes    Types: Marijuana     Allergies   Molds & smuts   Review of Systems Review of Systems  Gastrointestinal:  Positive for abdominal pain.     Physical Exam Triage Vital Signs ED Triage Vitals  Encounter Vitals Group     BP 10/27/24 1545 104/70     Girls Systolic BP Percentile --      Girls Diastolic BP Percentile --      Boys Systolic BP Percentile --      Boys Diastolic BP Percentile --      Pulse Rate 10/27/24 1545 67     Resp 10/27/24 1545 18     Temp 10/27/24 1545 98 F (36.7 C)     Temp Source 10/27/24 1545 Oral     SpO2 10/27/24 1545 98 %     Weight --      Height --      Head Circumference --      Peak Flow --      Pain Score 10/27/24 1541 4     Pain Loc --      Pain Education --      Exclude from Growth Chart --    No data found.  Updated Vital Signs BP 104/70 (BP Location: Right Arm)   Pulse 67   Temp 98 F (36.7 C) (Oral)   Resp 18   LMP 09/30/2024 (Exact Date)   SpO2 98%   Visual Acuity Right Eye Distance:   Left Eye Distance:   Bilateral Distance:    Right Eye Near:   Left Eye Near:    Bilateral Near:     Physical Exam Vitals and nursing note reviewed.  Constitutional:      General: She is not in acute distress.    Appearance: Normal appearance. She is not ill-appearing, toxic-appearing or diaphoretic.  Pulmonary:     Effort: Pulmonary effort is normal.  Abdominal:      Palpations: Abdomen is soft.     Tenderness: There is generalized abdominal tenderness and tenderness in the epigastric area and left upper quadrant.  Neurological:     Mental Status: She is alert.  Psychiatric:        Mood and Affect: Mood normal.      UC Treatments / Results  Labs (all labs ordered are listed, but only abnormal results are displayed) Labs Reviewed - No data to display  EKG   Radiology DG Abdomen 1 View Result Date: 10/27/2024 CLINICAL DATA:  Epigastric pain x1 week. EXAM: ABDOMEN - 1 VIEW COMPARISON:  None Available. FINDINGS: The bowel gas pattern is normal. A large amount of stool is seen within the ascending colon. No radio-opaque calculi are seen. A 4 mm radiopaque soft tissue foreign body is seen overlying the medial aspect of the left lower quadrant. Upon further evaluation of prior CT imaging this is located just below the skin of the posterior lower abdominal wall on the left. IMPRESSION: Large stool burden without evidence of bowel obstruction. Electronically Signed   By: Suzen Dials M.D.   On: 10/27/2024 17:23    Procedures Procedures (including critical care time)  Medications  Ordered in UC Medications - No data to display  Initial Impression / Assessment and Plan / UC Course  I have reviewed the triage vital signs and the nursing notes.  Pertinent labs & imaging results that were available during my care of the patient were reviewed by me and considered in my medical decision making (see chart for details).     Upper abdominal pain-patient has been eating and drinking normally.  She has had increased distention and gas.  X-ray revealed large stool burden which is most likely the cause of her symptoms.  Recommend MiraLAX twice a day or if this does not work she can try magnesium citrate over-the-counter.  Decrease fiber for now due to gas and bloating. Requested Carafate  to coat her stomach.  She has used this in the past that helped.  I  am not sure if this is going to help her problem but I will refill at this time. Recommend try to relieve the constipation first and see how it goes.  Otherwise follow-up with GI as planned on Wednesday Final Clinical Impressions(s) / UC Diagnoses   Final diagnoses:  Pain of upper abdomen     Discharge Instructions      You do have a large amount of stool on xray. I would recommend starting some miralax daily or twice daily until good bowel movement. If you want something faster acting you can do OTC magnesium citrate. This may cause some cramping initially. Drink plenty of water. You can continue the gas x. Avoid high fiber foods for now. I can give you more of the medication to coat your stomach but not sure if it will help. See the GI specialist on Wednesday as planned.      ED Prescriptions     Medication Sig Dispense Auth. Provider   sucralfate  (CARAFATE ) 1 g tablet Take 1 tablet (1 g total) by mouth 4 (four) times daily -  with meals and at bedtime for 3 days. 12 tablet Adah Corning A, FNP      I have reviewed the PDMP during this encounter.   Adah Corning LABOR, FNP 10/28/24 [phone]

## 2024-10-27 NOTE — ED Triage Notes (Signed)
  Pt  c/o of upper left quadrant abdomen pain, gas since Tuesday. Pt reports when she urinates and eat  her bladder is in pain and stomach extends out. Denies any burning, frequent urination. Pt is urinating and having normal bowel movements. Has taken gas x pill today.

## 2024-10-27 NOTE — Discharge Instructions (Signed)
 You do have a large amount of stool on xray. I would recommend starting some miralax daily or twice daily until good bowel movement. If you want something faster acting you can do OTC magnesium citrate. This may cause some cramping initially. Drink plenty of water. You can continue the gas x. Avoid high fiber foods for now. I can give you more of the medication to coat your stomach but not sure if it will help. See the GI specialist on Wednesday as planned.

## 2024-10-28 NOTE — Progress Notes (Addendum)
 10/29/2024 Laurie Guzman [id number] 11/05/1991  Referring provider: Okey Burns, MD Primary GI doctor: Dr. Charlanne  ASSESSMENT AND PLAN:  Epigastric pain/GERD 2023 HIDA negative EGD [facility] 2024, will get records  ADDENDUM: 07/06/23 EGD unremarkable no hiatal hernia biopsies negative for eosinophilic esophagitis dysplasia and H. pylori 10/23/2024 CTAP [name] showed constipation and ovarian cyst Possibly secondary to constipation, GERD, gastroparesis secondary to infection last Dec Symptoms include nausea, acid reflux, and postnasal drip. Previous PPI trials were ineffective. ENT evaluation suggested laryngopharyngeal reflux. Symptoms may be exacerbated by dietary choices and marijuana use. - Provided information on laryngopharyngeal reflux. - Provided samples of Reflux Gourmet gum. - Ordered upper endoscopy to evaluate for gastroparesis and other potential causes. -Instructed to quit smoking marijuana, can take up to 9-12 months of cessation.  - consider GES  Constipation, has urinary symptoms, has to push on perineium 10/27/2024 KUB large stool burden Tried miralax, mig citrate without help Chronic constipation with abdominal pain, bloating, and difficulty passing stool. Possible pelvic floor dysfunction and internal hemorrhoids. CT showed constipation. Rectal exam revealed internal hemorrhoids. Symptoms may be exacerbated by pelvic floor dysfunction post-childbirth. - Initiated bowel purge with Miralax and Dulcolax tablets. - Started Linzess 145/290 after bowel purge. - Provided information on pelvic floor dysfunction and proper bowel movements. - Referred to GYN for evaluation of pelvic floor dysfunction and concern for rectocele with symptoms - Provided antispasmodic medication (Levesin or hyoscyamine ) for abdominal pain as needed.  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 Symptoms  worsened after influenza infection. Previous endoscopy was normal. Gastroparesis may be viral-induced and could improve over time. - Provided information on gastroparesis and dietary modifications. - Ordered upper endoscopy to evaluate for gastroparesis. - Will consider gastroparesis study if symptoms persist.  Suspected cannabinoid hyperemesis syndrome Symptoms of nausea and abdominal pain. Long-term marijuana use noted. Symptoms may be related to cannabinoid hyperemesis syndrome. - Provided information on cannabinoid hyperemesis syndrome. - Advised cessation of marijuana use for 6-9 months to evaluate for improvement.  Anxiety disorder Anxiety related to health concerns. Symptoms include health anxiety and avoidance of medications due to fear of addiction. Anxiety may contribute to gastrointestinal symptoms. - Provided reassurance and education on anxiety management. - Discussed non-addictive options for managing anxiety-related symptoms  Patient Care Team: Fernand Tracey LABOR, MD as PCP - General (Family Medicine)  HISTORY OF PRESENT ILLNESS: 33 y.o. female with a past medical history listed below presents for evaluation of AB pain, GERD, constipation.   Discussed the use of AI scribe software for clinical note transcription with the patient, who gave verbal consent to proceed.  History of Present Illness   Laurie Guzman is a 33 year old female with IBS who presents with abdominal pain and gastrointestinal symptoms. She was referred by an ENT specialist for evaluation of gastrointestinal symptoms.  She experiences severe abdominal pain, described as twisting and burning, particularly after urination. The pain is not localized to the bladder but feels like pressure is released after urination, causing significant discomfort. She also experiences extreme nausea but avoids vomiting due to a dislike of it. She has a history of ovarian cysts, with a recent CT scan showing constipation and an  ovarian cyst. An ultrasound indicated a right ovarian cyst. She has experienced similar pain previously and has a history of ovarian cysts.  She reports ongoing gastrointestinal symptoms, including acid reflux that worsens when she does not eat, and constant postnasal drip with phlegm.  She has a history of IBS, which has become painful post-pregnancy. She experiences bloating, feeling full quickly, and constipation. She has tried Miralax and magnesium citrate with limited success. She describes episodes of diarrhea following constipation, feeling 'constipated with diarrhea.'  She has a history of migraines, which she associates with stomach issues, and has stopped taking NSAIDs due to gastritis. She reports occasional marijuana use for anxiety, which she believes helps with stress. She has a history of flu, which she believes has contributed to ongoing breathing issues, though lung evaluations have been normal.  She reports a family history of polyps and gastrointestinal issues. She has experienced rectal bleeding and hemorrhoids post-pregnancy, which she manages with wipes. She describes needing to manually assist bowel movements by applying pressure externally. She has not seen a gynecologist since her last child, who is three years old.  She has undergone various diagnostic tests, including a CT scan, ultrasound, HIDA scan, and upper endoscopy, which have not revealed significant abnormalities aside from constipation and ovarian cysts. Her symptoms have persisted despite these evaluations.      She  reports that she has been smoking cigarettes. She has never used smokeless tobacco. She reports current alcohol use. She reports current drug use. Drug: Marijuana.  RELEVANT GI HISTORY, IMAGING AND LABS: Results   RADIOLOGY CT abdomen and pelvis: Constipation, ovarian cysts (10/23/2024) Ultrasound: Right ovarian possible ruptured cyst  DIAGNOSTIC Upper endoscopy: Normal (06/2023) HIDA scan: Normal  ejection fraction (12/23/2021)     10/23/2024  IMPRESSION: Probable ruptured right ovarian cysts. No evidence of ovarian torsion.   10/23/2024 CTAP W IMPRESSION: Small amount of free pelvic fluid with complex lesion posterior the uterus. Favor the right ovary. Increased in size since the prior exam. Possibly corpus luteum or even endometrioma. Correlation with pelvic ultrasound likely helpful.  Constipation.  CBC    Component Value Date/Time   WBC 11.6 (H) 11/12/2020 1717   RBC 4.44 11/12/2020 1717   HGB 13.4 11/12/2020 1717   HGB 13.9 11/02/2020 1141   HCT 39.6 11/12/2020 1717   HCT 39.1 11/02/2020 1141   PLT 220 11/12/2020 1717   PLT 241 11/02/2020 1141   MCV 89.2 11/12/2020 1717   MCV 89 11/02/2020 1141   MCH 30.2 11/12/2020 1717   MCHC 33.8 11/12/2020 1717   RDW 12.3 11/12/2020 1717   RDW 12.1 11/02/2020 1141   LYMPHSABS 1.8 11/12/2020 1717   MONOABS 0.4 11/12/2020 1717   EOSABS 0.1 11/12/2020 1717   BASOSABS 0.0 11/12/2020 1717   No results for input(s): HGB in the last 8760 hours.  CMP     Component Value Date/Time   NA 135 11/12/2020 1717   NA 138 11/02/2020 1141   K 3.4 (L) 11/12/2020 1717   CL 104 11/12/2020 1717   CO2 20 (L) 11/12/2020 1717   GLUCOSE 149 (H) 11/12/2020 1717   BUN 10 11/12/2020 1717   BUN 10 11/02/2020 1141   CREATININE 0.59 11/12/2020 1717   CALCIUM 9.1 11/12/2020 1717   PROT 6.5 11/12/2020 1717   PROT 6.8 11/02/2020 1141   ALBUMIN 3.4 (L) 11/12/2020 1717   ALBUMIN 4.2 11/02/2020 1141   AST 16 11/12/2020 1717   ALT 15 11/12/2020 1717   ALKPHOS 63 11/12/2020 1717   BILITOT 0.6 11/12/2020 1717   BILITOT 0.3 11/02/2020 1141   GFRNONAA >60 11/12/2020 1717   GFRAA 144 11/02/2020 1141      Latest Ref Rng & Units 11/12/2020    5:17 PM 11/02/2020   11:41  AM 06/03/2020    6:45 PM  Hepatic Function  Total Protein 6.5 - 8.1 g/dL 6.5  6.8  7.2   Albumin 3.5 - 5.0 g/dL 3.4  4.2  4.2   AST 15 - 41 U/L 16  10  17    ALT 0 - 44 U/L  15  10  20    Alk Phosphatase 38 - 126 U/L 63  72  77   Total Bilirubin 0.3 - 1.2 mg/dL 0.6  0.3  0.8       Current Medications:        Current Outpatient Medications (Other):    B Complex Vitamins (B COMPLEX PO), Take by mouth as needed.   clonazePAM (KLONOPIN) 0.5 MG tablet, Take 0.5 mg by mouth daily as needed for anxiety.   hyoscyamine  (LEVSIN ) 0.125 MG tablet, Take 1 tablet (0.125 mg total) by mouth every 6 (six) hours as needed for cramping.   L-Glutamine 500 MG CAPS, Take by mouth.   MAGNESIUM GLYCINATE PO, Take by mouth as needed.   VITAMIN D PO, Take by mouth as needed.  Medical History:  Past Medical History:  Diagnosis Date   ADHD    Anxiety    Bipolar 1 disorder (HCC)    Bladder spasms    BMI 27.0-27.9,adult    Chlamydia infection 06/2008   Chronic eczematous otitis externa of both ears 04/12/2023   Chronic migraine without aura without status migrainosus, not intractable 08/15/2021   Dysfunction of both eustachian tubes 04/12/2023   Gastritis    GERD (gastroesophageal reflux disease)    Goiter 04/11/2021   IBS (irritable bowel syndrome)    Intermenstrual spotting    Migraine    Palpitations 09/25/2023   Postural lightheadedness 09/25/2023   Pre-eclampsia 08/16/2014   Referred ear pain, bilateral 04/12/2023   Second degree AV block, Mobitz type I, asymptomatic during sleeping hours on Zio patch 09/25/2023   Severe anxiety with panic    Stomach ulcer    Tinnitus of both ears 04/12/2023   UTI (urinary tract infection)    used to get bad UTIs   Allergies:  Allergies  Allergen Reactions   Molds & Smuts      Surgical History:  She  has a past surgical history that includes Wisdom tooth extraction. Family History:  Her family history includes Allergic rhinitis in her maternal grandmother; COPD in her paternal aunt and paternal grandmother; Irritable bowel syndrome in her father and mother; Migraines in her paternal aunt, paternal aunt, and paternal  grandmother; Ulcers in her father.  REVIEW OF SYSTEMS  : All other systems reviewed and negative except where noted in the History of Present Illness.  PHYSICAL EXAM: BP 122/84   Pulse 70   Ht 5' 5.5 (1.664 m)   Wt 175 lb (79.4 kg)   LMP 09/30/2024 (Exact Date)   BMI 28.68 kg/m  Physical Exam   GENERAL APPEARANCE: Well nourished, in no apparent distress. HEENT: No cervical lymphadenopathy, unremarkable thyroid, sclerae anicteric, conjunctiva pink. RESPIRATORY: Respiratory effort normal, breath sounds clear to auscultation bilaterally without rales, rhonchi, or wheezing. CARDIO: Regular rate and rhythm with no murmurs, rubs, or gallops, peripheral pulses intact. ABDOMEN: Soft, non-distended, active bowel sounds in all four quadrants, no tenderness to palpation, no rebound, no mass appreciated. RECTAL: No external hemorrhoids, no rectal masses, no significant stool in rectum, internal hemorrhoids present, no blood on rectal exam. MUSCULOSKELETAL: Full range of motion, normal gait, without edema. SKIN: Dry, intact without rashes or lesions. No jaundice. NEURO:  Alert, oriented, no focal deficits. PSYCH: Cooperative, normal mood and affect.      Alan JONELLE Coombs, PA-C 3:54 PM

## 2024-10-29 ENCOUNTER — Encounter: Payer: Self-pay | Admitting: Physician Assistant

## 2024-10-29 ENCOUNTER — Ambulatory Visit: Payer: MEDICAID | Admitting: Physician Assistant

## 2024-10-29 ENCOUNTER — Other Ambulatory Visit: Payer: MEDICAID

## 2024-10-29 VITALS — BP 122/84 | HR 70 | Ht 65.5 in | Wt 175.0 lb

## 2024-10-29 DIAGNOSIS — M6289 Other specified disorders of muscle: Secondary | ICD-10-CM

## 2024-10-29 DIAGNOSIS — K589 Irritable bowel syndrome without diarrhea: Secondary | ICD-10-CM | POA: Diagnosis not present

## 2024-10-29 DIAGNOSIS — K219 Gastro-esophageal reflux disease without esophagitis: Secondary | ICD-10-CM

## 2024-10-29 DIAGNOSIS — K5904 Chronic idiopathic constipation: Secondary | ICD-10-CM

## 2024-10-29 DIAGNOSIS — G43709 Chronic migraine without aura, not intractable, without status migrainosus: Secondary | ICD-10-CM

## 2024-10-29 DIAGNOSIS — R32 Unspecified urinary incontinence: Secondary | ICD-10-CM

## 2024-10-29 DIAGNOSIS — F319 Bipolar disorder, unspecified: Secondary | ICD-10-CM

## 2024-10-29 LAB — SEDIMENTATION RATE: Sed Rate: 3 mm/h (ref 0–20)

## 2024-10-29 LAB — C-REACTIVE PROTEIN: CRP: <0.5 mg/dL (ref 0.5–20.0)

## 2024-10-29 MED ORDER — HYOSCYAMINE SULFATE 0.125 MG PO TABS: 0.1250 mg | ORAL_TABLET | Freq: Four times a day (QID) | ORAL | 0 refills | Status: AC | PRN

## 2024-10-29 NOTE — Patient Instructions (Addendum)
 Your provider has requested that you go to the basement level for lab work before leaving today. Press B on the elevator. The lab is located at the first door on the left as you exit the elevator.   Silent reflux: Not all heartburn burns...SABRASABRASABRA  What is LPR? Laryngopharyngeal reflux (LPR) or silent reflux is a condition in which acid that is made in the stomach travels up the esophagus (swallowing tube) and gets to the throat. Not everyone with reflux has a lot of heartburn or indigestion. In fact, many people with LPR never have heartburn. This is why LPR is called SILENT REFLUX, and the terms Silent reflux and LPR are often used interchangeably. Because LPR is silent, it is sometimes difficult to diagnose.  How can you tell if you have LPR?  Chronic hoarseness- Some people have hoarseness that comes and goes throat clearing  Cough It can cause shortness of breath and cause asthma like symptoms. a feeling of a lump in the throat  difficulty swallowing a problem with too much nose and throat drainage.  Some people will feel their esophagus spasm which feels like their heart beating hard and fast, this will usually be after a meal, at rest, or lying down at night.    How do I treat this? Treatment for LPR should be individualized, and your doctor will suggest the best treatment for you. Generally there are several treatments for LPR: changing habits and diet to reduce reflux,  medications to reduce stomach acid, and  surgery to prevent reflux. Most people with LPR need to modify how and when they eat, as well as take some medication, to get well. Sometimes, nonprescription liquid antacids, such as Maalox, Gelucil and Mylanta are recommended. When used, these antacids should be taken four times each day - one tablespoon one hour after each meal and before bedtime. Dietary and lifestyle changes alone are not often enough to control LPR - medications that reduce stomach acid are also  usually needed. These must be prescribed by our doctor.   TIPS FOR REDUCING REFLUX AND LPR Control your LIFE-STYLE and your DIET! If you use tobacco, QUIT.  Smoking makes you reflux. After every cigarette you have some LPR.  Don't wear clothing that is too tight, especially around the waist (trousers, corsets, belts).  Do not lie down just after eating...in fact, do not eat within three hours of bedtime.  You should be on a low-fat diet.  Limit your intake of red meat.  Limit your intake of butter.  Avoid fried foods.  Avoid chocolate  Avoid cheese.  Avoid eggs. Specifically avoid caffeine (especially coffee and tea), soda pop (especially cola) and mints.  Avoid alcoholic beverages, particularly in the evening.   Reflux Gourmet Rescue  It is an ALGINATE THERAPY which is the only intervention that works to safeguard the esophagus by creating a protective barrier that actually stops reflux from happening. -The general directions for use are as stated on the packaging: Take 1 teaspoon (5 ml), or more as needed or as directed by your physician, after meals and before bed. -These general directions address the most common times for reflux to occur, but our Rescue products may be taken anytime. Some individuals may take our product preemptively, when they know they will suffer from reflux, or as needed - when discomfort arises. (If taken around food, it should be consumed last.) -You do not have to take 1 teaspoon (5 ml) of the product. While one teaspoon (5ml) may  be the perfect average amount to relieve reflux suffering in some, others may require more or less. You may adjust the amount of Mint Chocolate Rescue and Vanilla Caramel Rescue to the lowest amount necessary to meet your individual needs to improve your quality of life. -You may dilute the product if it is too viscous for you to consume. Keep in mind, however, that the thickness of the product was formulated to provide optimal coating  and protection of your throat and esophagus. Though diluting the product is possible, it may reduce the protective function and/or length of action. -This can be used in conjunction with reflux medications and lifestyle changes.  100% ALL-NATURAL  Paraben FREE, glycerin FREE, & potassium FREE  Made entirely from all-natural ingredients considered safe for children and during pregnancy  No known side effects  All-natural flavor Gluten FREE  Allergen FREE  Vegan  Can find more information here: nameseizer.co.nz  Gastroparesis Gastroparesis is a condition in which food takes longer than normal to empty from the stomach.  This condition is also known as delayed gastric emptying. It is usually a long-term (chronic) condition.  What are the signs or symptoms? Symptoms of this condition include: Feeling full after eating very little or a loss of appetite. Nausea, vomiting, or heartburn. Bloating of your abdomen. Inconsistent blood sugar (glucose) levels on blood tests. Unexplained weight loss. Acid from the stomach coming up into the esophagus (gastroesophageal reflux). Sudden tightening (spasm) of the stomach, which can be painful. Symptoms may come and go. Some people may not notice any symptoms.  What increases the risk? You are more likely to develop this condition if: You have certain disorders or diseases. These may include: An endocrine disorder. An eating disorder. Amyloidosis. Scleroderma. Parkinson's disease. Multiple sclerosis. Cancer or infection of the stomach or the vagus nerve. You have had surgery on your stomach or vagus nerve. You take certain medicines. You are female.  Things you can do: Please do small frequent meals like 4-6 meals a day.  Eat and drink liquids at separate times.  Avoid high fiber foods, cook your vegetables, avoid high fat food.  Suggest spreading protein throughout the day (greek yogurt, glucerna, soft  meat, milk, eggs) Choose soft foods that you can mash with a fork When you are more symptomatic, change to pureed foods foods and liquids.  Consider reading Living well with Gastroparesis by [facility] Check out this link to a diet online https://my.groupjournal.fr  Please do the following: Purchase a bottle of Miralax over the counter as well as a box of 5 mg dulcolax tablets. Take 4 dulcolax tablets. Wait 1 hour. You will then drink 6-8 capfuls of Miralax mixed in an adequate amount of water/juice/gatorade (you may choose which of these liquids to drink) over the next 2-3 hours. You should expect results within 1 to 6 hours after completing the bowel purge. Go to the er if you have severe AB pain, can not pass gas or stool in over 12 hours, can not hold down any food.    Linzess 145mcg, can increase to the 290 mcg *IBS-C patients may begin to experience relief from belly pain and overall abdominal symptoms (pain, discomfort, and bloating) in about 1 week,  with symptoms typically improving over 12 weeks.  Take at least 30 minutes before the first meal of the day on an empty stomach You can have a loose stool if you eat a high-fat breakfast. Give it at least 7 days, may have more bowel  movements during that time.   The diarrhea should go away and you should start having normal, complete, full bowel movements.  It may be helpful to start treatment when you can be near the comfort of your own bathroom, such as a weekend.  After you are out we can send in a prescription if you did well, there is a prescription card  Please let me know your thoughts when you have a chance.  Toileting tips to help with your constipation - Drink at least 64-80 ounces of water/liquid per day. - Establish a time to try to move your bowels every day.  For many people, this is after a cup of coffee or after a  meal such as breakfast. - Sit all of the way back on the toilet keeping your back fairly straight and while sitting up, try to rest the tops of your forearms on your upper thighs.   - Raising your feet with a step stool/squatty potty can be helpful to improve the angle that allows your stool to pass through the rectum. - Relax the rectum feeling it bulge toward the toilet water.  If you feel your rectum raising toward your body, you are contracting rather than relaxing. - Breathe in and slowly exhale. Belly breath by expanding your belly towards your belly button. Keep belly expanded as you gently direct pressure down and back to the anus.  A low pitched GRRR sound can assist with increasing intra-abdominal pressure.  (Can also trying to blow on a pinwheel and make it move, this helps with the same belly breathing) - Repeat 3-4 times. If unsuccessful, contract the pelvic floor to restore normal tone and get off the toilet.  Avoid excessive straining. - To reduce excessive wiping by teaching your anus to normally contract, place hands on outer aspect of knees and resist knee movement outward.  Hold 5-10 second then place hands just inside of knees and resist inward movement of knees.  Hold 5 seconds.  Repeat a few times each way.  Go to the ER if unable to pass gas, severe AB pain, unable to hold down food, any shortness of breath of chest pain.  ___________________________  Cannabinoid Hyperemesis Syndrome  What is cannabinoid hyperemesis syndrome?  Cannabinoid hyperemesis syndrome (CHS) is a condition that leads to repeated and severe bouts of vomiting. It is rare and only occurs in daily long-term users of marijuana.  Marijuana has several active substances. These include THC and related chemicals. These substances bind to molecules found in the brain. That causes the drug "high" and other effects that users feel.  Your digestive tract also has a number of molecules that bind to [facility] and  related substances. So marijuana also affects the digestive tract. For example, the drug can change the time it takes the stomach to empty. It also affects the esophageal sphincter. That's the tight band of muscle that opens and closes to let food from the esophagus into the stomach. Long-term marijuana use can change the way the affected molecules respond and lead to the symptoms of CHS.  Marijuana is the most widely used recreational drug in the [place] Young adults are the most frequent users. A small number of these people develop CHS. It often only happens in people who have regularly used marijuana. Often CHS affects those who use the drug at least once a day.  What causes cannabinoid hyperemesis syndrome?  Marijuana has very complex effects on the body. Experts are still trying to learn exactly  how it causes CHS in some people.  In the brain, marijuana often has the opposite effect of CHS. It helps prevent nausea and vomiting. The drug is also good at stopping such symptoms in people having chemotherapy.  But in the digestive tract, marijuana seems to have the opposite effect. It actually makes you more likely to have nausea and vomiting. With the first use of marijuana, the signals from the brain may be more important. That may lead to anti-nausea effects at first. But with repeated use of marijuana, certain receptors in the brain may stop responding to the drug in the same way. That may cause the repeated bouts of vomiting found in people with CHS.  It still isn't clear why some heavy marijuana users get the syndrome, but others don't.  What are the symptoms of cannabinoid hyperemesis syndrome?  People with CHS suffer from repeated bouts of vomiting. In between these episodes are times without any symptoms. Healthcare providers often divide these symptoms into 3 stages: the prodromal phase, the hyperemetic phase, and the recovery phase.  Prodromal phase. During this phase, the main symptoms  are often early morning nausea and belly (abdominal) pain. Some people also develop a fear of vomiting. Most people keep normal eating patterns during this time. Some people use more marijuana because they think it will help stop the nausea. This phase may last for months or years.  Hyperemetic phase. Symptoms during this time may include:  Ongoing nausea  Repeated episodes of vomiting  Belly pain  Decreased food intake and weight loss  Symptoms of fluid loss (dehydration)  During this phase, vomiting is often intense and overwhelming. Many people take a lot of hot showers during the day. They find that doing so eases their nausea. (That may be because of how the hot temperature affects a part of the brain called the hypothalamus. This part of the brain effects both temperature regulation and vomiting.) People often first seek medical care during this phase.  The hyperemetic phase may continue until the person completely stops using marijuana. Then the recovery phase starts.  Recovery phase. During this time, symptoms go away. Normal eating is possible again. This phase can last days or months. Symptoms often come back if the person tries marijuana again.  How is cannabinoid hyperemesis syndrome diagnosed?  Many health problems can cause repeated vomiting. To make a diagnosis, your healthcare provider will ask you about your symptoms and your past health. He or she will also do a physical exam, including an exam of your belly. Often this diagnosis can be made from history alone, however.  Your healthcare provider may also need more tests to rule out other causes of the vomiting.  CHS was only recently discovered. So some healthcare providers may not know about it. As a result, they may not spot it for many years. They often confuse CHS with cyclical vomiting disorder. That is a health problem that causes similar symptoms. A specialist trained in diseases of the digestive tract  (gastroenterologist) might make the diagnosis.  You may have CHS if you have all of these:  Long-term weekly and daily marijuana use  Belly pain  Severe, repeated nausea and vomiting  You feel better after taking a hot shower  There is no single test that confirms this diagnosis. Only improvement after quitting marijuana confirms the diagnosis.  How is cannabinoid hyperemesis syndrome treated?  To fully get better, you need to stop using marijuana all together. If you stop using marijuana,  your symptoms should not come back.  Symptoms often ease after a day or 2 unless marijuana is used before this time.  In a small sample of people with CHS, rubbing capsaicin cream on the belly helped decrease pain and nausea. The chemicals in the cream have the same effect as a hot shower.  Quitting marijuana may lead to other health benefits, such as:  Better lung function  Improved memory and thinking skills  Better sleep  Key points about cannabinoid hyperemesis syndrome  CHS is a condition that leads to repeated and severe bouts of vomiting. It results from long-term use of marijuana.  Most people self-treat using hot showers to help reduce their symptoms.  Some people with CHS may not be diagnosed for several years. Admitting to your healthcare provider that you use marijuana daily can speed up the diagnosis.  Symptoms start to go away within a day or 2 after stopping marijuana use.  Symptoms can come back if you use marijuana again   _______________________________________________________  If your blood pressure at your visit was 140/90 or greater, please contact your primary care physician to follow up on this.  _______________________________________________________  If you are age 32 or older, your body mass index should be between 23-30. Your Body mass index is 28.68 kg/m. If this is out of the aforementioned range listed, please consider follow up with your Primary Care  Provider.  If you are age 2 or younger, your body mass index should be between 19-25. Your Body mass index is 28.68 kg/m. If this is out of the aformentioned range listed, please consider follow up with your Primary Care Provider.   ________________________________________________________  The [facility] providers would like to encourage you to use MYCHART to communicate with providers for non-urgent requests or questions.  Due to long hold times on the telephone, sending your provider a message by [facility] may be a faster and more efficient way to get a response.  Please allow 48 business hours for a response.  Please remember that this is for non-urgent requests.  _______________________________________________________  [facility] is using a team-based approach to care.  Your team is made up of your doctor and two to three APPS. Our APPS (Nurse Practitioners and Physician Assistants) work with your physician to ensure care continuity for you. They are fully qualified to address your health concerns and develop a treatment plan. They communicate directly with your gastroenterologist to care for you. Seeing the Advanced Practice Practitioners on your physician's team can help you by facilitating care more promptly, often allowing for earlier appointments, access to diagnostic testing, procedures, and other specialty referrals.

## 2024-10-30 ENCOUNTER — Ambulatory Visit: Payer: Self-pay | Admitting: Physician Assistant

## 2024-10-30 LAB — IGA: Immunoglobulin A: 289 mg/dL (ref 47–310)

## 2024-10-30 LAB — TISSUE TRANSGLUTAMINASE, IGA: (tTG) Ab, IgA: <1 U/mL

## 2024-11-26 ENCOUNTER — Other Ambulatory Visit: Payer: Self-pay

## 2024-11-26 DIAGNOSIS — K5904 Chronic idiopathic constipation: Secondary | ICD-10-CM

## 2024-11-26 DIAGNOSIS — R109 Unspecified abdominal pain: Secondary | ICD-10-CM

## 2024-11-26 DIAGNOSIS — M6289 Other specified disorders of muscle: Secondary | ICD-10-CM

## 2024-11-26 DIAGNOSIS — R32 Unspecified urinary incontinence: Secondary | ICD-10-CM

## 2024-11-26 MED ORDER — DICYCLOMINE HCL 20 MG PO TABS: 20.0000 mg | ORAL_TABLET | Freq: Three times a day (TID) | ORAL | 0 refills | Status: AC | PRN

## 2024-11-26 MED ORDER — LUBIPROSTONE 8 MCG PO CAPS: 8.0000 ug | ORAL_CAPSULE | Freq: Two times a day (BID) | ORAL | 0 refills | Status: AC

## 2024-11-26 NOTE — Telephone Encounter (Signed)
 Pt made aware of Alan Coombs PA recommendations.  Prescriptions was sent to pharmacy. Pt made aware.  Referral to GYN sent via epic. Pt made aware that there office will contact them.  Pt has office visit that is previously scheduled.   Pt verbalized understanding with all questions answered.

## 2024-12-16 ENCOUNTER — Encounter: Payer: Self-pay | Admitting: Physician Assistant

## 2024-12-16 ENCOUNTER — Other Ambulatory Visit: Payer: MEDICAID

## 2024-12-16 ENCOUNTER — Ambulatory Visit: Payer: MEDICAID | Admitting: Physician Assistant

## 2024-12-16 VITALS — BP 122/60 | HR 78 | Ht 65.5 in | Wt 177.4 lb

## 2024-12-16 DIAGNOSIS — R1012 Left upper quadrant pain: Secondary | ICD-10-CM

## 2024-12-16 DIAGNOSIS — M9905 Segmental and somatic dysfunction of pelvic region: Secondary | ICD-10-CM

## 2024-12-16 DIAGNOSIS — K219 Gastro-esophageal reflux disease without esophagitis: Secondary | ICD-10-CM

## 2024-12-16 DIAGNOSIS — R1013 Epigastric pain: Secondary | ICD-10-CM

## 2024-12-16 DIAGNOSIS — R109 Unspecified abdominal pain: Secondary | ICD-10-CM

## 2024-12-16 DIAGNOSIS — F419 Anxiety disorder, unspecified: Secondary | ICD-10-CM

## 2024-12-16 DIAGNOSIS — F319 Bipolar disorder, unspecified: Secondary | ICD-10-CM

## 2024-12-16 DIAGNOSIS — R110 Nausea: Secondary | ICD-10-CM | POA: Diagnosis not present

## 2024-12-16 DIAGNOSIS — K5900 Constipation, unspecified: Secondary | ICD-10-CM

## 2024-12-16 DIAGNOSIS — K589 Irritable bowel syndrome without diarrhea: Secondary | ICD-10-CM

## 2024-12-16 DIAGNOSIS — F1210 Cannabis abuse, uncomplicated: Secondary | ICD-10-CM | POA: Diagnosis not present

## 2024-12-16 DIAGNOSIS — M6289 Other specified disorders of muscle: Secondary | ICD-10-CM

## 2024-12-16 DIAGNOSIS — K5904 Chronic idiopathic constipation: Secondary | ICD-10-CM

## 2024-12-16 MED ORDER — PEG 3350-KCL-NA BICARB-NACL 420 G PO SOLR: ORAL | 0 refills | Status: AC

## 2024-12-16 MED ORDER — IBSRELA 50 MG PO TABS: 50.0000 mg | ORAL_TABLET | Freq: Two times a day (BID) | ORAL | 3 refills | Status: AC

## 2024-12-16 NOTE — Patient Instructions (Addendum)
 Your provider has requested that you go to the basement level for lab work before leaving today. Press B on the elevator. The lab is located at the first door on the left as you exit the elevator.  Will send in [place]- take as directed- but do 6 oz every 30 mins until the impaction passes, can do 1/2 gallon on first day and 1/2 gallon on 2nd day.  Can continue 2 fleets enemas a day until it resolves.  Go to ER if any severe AB pain, vomiting/unable to hold down food/drink, blood in stool, or unable to pass gas.  Then start on isbrella samples Can do trial of isbrela for IBS constipation Take Ibsrela  (tenapanor) 5 to 10 minutes before eating breakfast and dinner. This can make the medication work better for you compared to taking it on an empty stomach Stop if you have any significant diarrhea/dehydration.  Need to do pelvic floor PT physical, suggest this  You have been scheduled for an abdominal ultrasound at [facility] (1st floor of hospital) on 12/24/2024 at 9 am. Please arrive 30 minutes prior to your appointment for registration. Make certain not to have anything to eat or drink 6 hours prior to your appointment. Should you need to reschedule your appointment, please contact radiology at [phone]. This test typically takes about 30 minutes to perform.   Due to recent changes in healthcare laws, you may see the results of your imaging and laboratory studies on MyChart before your provider has had a chance to review them.  We understand that in some cases there may be results that are confusing or concerning to you. Not all laboratory results come back in the same time frame and the provider may be waiting for multiple results in order to interpret others.  Please give us  48 hours in order for your provider to thoroughly review all the results before contacting the office for clarification of your results.    I appreciate the  opportunity to care for you  Thank You   [facility]

## 2024-12-16 NOTE — Progress Notes (Signed)
 "    12/16/2024 Laurie Guzman [id number] 1991/07/31  Referring provider: Fernand Tracey LABOR, MD Primary GI doctor: Dr. Charlanne  ASSESSMENT AND PLAN:  Epigastric pain/GERD 2023 HIDA negative 07/06/23 EGD unremarkable no hiatal hernia biopsies negative for eosinophilic esophagitis dysplasia and H. pylori 10/23/2024 CTAP [name] showed constipation and ovarian cyst Possibly secondary to constipation, GERD, gastroparesis secondary to infection last Dec, cannabinoid hyperemesis Epigastric AB pain, carafate  helped some, PPI does not help, pepcid  does not help, I think a lot of this is from severe constipation, anxiety - will get H pylori stool - get AB US , consider GES - consider upper endoscopy but low yeild -Instructed to quit smoking marijuana, can take up to 9-12 months of cessation.   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 - Provided instructions for adjunctive enema use if needed. - Planned to initiate Isbrela following bowel purge; provided samples and sent prescription to mail order pharmacy for insurance coverage assessment.  Suspected cannabinoid hyperemesis syndrome Symptoms of nausea and abdominal pain. Long-term marijuana use noted. Symptoms may be related to cannabinoid hyperemesis syndrome. - Provided information on cannabinoid hyperemesis syndrome. - Advised cessation of marijuana use for 6-9 months to evaluate for improvement.  Anxiety disorder/pressured speech Anxiety related to health concerns. Symptoms include health anxiety and avoidance of medications due to fear of  addiction. Anxiety may contribute to gastrointestinal symptoms. - Provided reassurance and education on anxiety management. - Discussed non-addictive options for managing anxiety-related symptoms  Patient Care Team: Fernand Tracey LABOR, MD as PCP - General (Family Medicine)  HISTORY OF PRESENT ILLNESS: 34 y.o. female with a past medical history listed below presents for evaluation of AB pain, GERD, constipation.   I last saw the patient in the office 10/29/2024.  She had negative sed rate CRP celiac panel and was referred to urogynecology given Levsin   Discussed the use of AI scribe software for clinical note transcription with the patient, who gave verbal consent to proceed.  History of Present Illness   Laurie Guzman is a 34 year old female with chronic idiopathic constipation, pelvic floor dysfunction, and gastroesophageal reflux disease who presents for follow-up of persistent gastrointestinal symptoms and medication side effects.  Constipation has been longstanding, with significant stool burden confirmed by abdominal x-ray in November. Multiple therapies have been trialed, including Linzess (145 mcg and 290 mcg once daily), Amitiza  twice daily, Miralax, Dulcolax, and stool softeners, without meaningful improvement. Linzess and Amitiza  provided no benefit, and a prior bowel purge resulted in only minimal diarrhea and incomplete relief. Difficulty with bowel movements, abdominal pain, and bloating persist. Manual disimpaction has been required in the past despite multiple medications. Dietary changes have occurred due to decreased appetite, though not intentionally, and she sometimes feels her body does not need food.  Since starting Bentyl  and Amitiza  approximately two weeks ago, severe acid reflux and burning epigastric pain have developed. She is unsure which medication is responsible. Acid reflux is described as 'horrible' and is accompanied by a burning sensation in the stomach. A sensation  of a 'bubble' under the rib is irritating but not painful, and she massages the area for relief. Pressure and  pain in the upper abdomen are present, with marked tenderness on one side. Carafate  is used as needed for severe stomach pain, providing temporary relief for about three hours, typically in the morning.  Intermittent nausea occurs, sometimes worse than heartburn, without vomiting after eating. Appetite is variable, with early satiety most days. She sometimes eats to prevent nausea, such as a grilled chicken biscuit in the morning. Fewer gastrointestinal symptoms were noted around Christmas, though she had a viral sinus infection at that time that resolved without treatment. No dark or bloody stools have been noted.  Back pain is localized to the area where she perceives her lymph nodes to be, with swelling and palpable 'little bumps' that are painful and require a heating pad. Similar 'bubbles' are perceived in the front of the abdomen that swell when she is in pain. No yellowing of the eyes or skin. Chest pain is present, attributed to acid reflux, and she is unable to distinguish it from cardiac pain. Prior heartburn/reflux medications, including two different agents taken for three months each and Tums, were ineffective. She is not currently on a medication for reflux.  Dry eyes are noted, sometimes feeling as if 'lotion is in them.' Pain in the lower abdomen occurs after urination, particularly when the bladder is full and then emptied, which she associates with ongoing constipation and abdominal fullness.  Ovarian cysts rupture approximately every other month. She is unable to see a gynecologist due to lack of availability in her area; her primary care performs Pap smears. Mold was recently discovered in her house prior to  Thanksgiving.  She uses marijuana and has recently cut back, without noticing a difference in symptoms. Marijuana is preferred over SSRIs for management of bipolar  disorder and ADHD, as SSRIs worsen her mental health. Significant stress is present related to childcare responsibilities, as her fianc works out of town during the week and she cares for three children, including a three-year-old.  Her grandmother has had H. pylori infection multiple times and similar health problems. She had a negative H. pylori test two years ago. Her mother does not have these issues, but she feels her symptoms mirror her grandmother's history.      She  reports that she has been smoking cigarettes. She has never used smokeless tobacco. She reports current alcohol use. She reports current drug use. Drug: Marijuana.  RELEVANT GI HISTORY, IMAGING AND LABS: Results   Radiology Abdominal X-ray (10/2024): Large stool burden predominantly in the right abdomen, minimal stool in the left abdomen     10/23/2024  IMPRESSION: Probable ruptured right ovarian cysts. No evidence of ovarian torsion.   10/23/2024 CTAP W IMPRESSION: Small amount of free pelvic fluid with complex lesion posterior the uterus. Favor the right ovary. Increased in size since the prior exam. Possibly corpus luteum or even endometrioma. Correlation with pelvic ultrasound likely helpful.  Constipation.  CBC    Component Value Date/Time   WBC 11.6 (H) 11/12/2020 1717   RBC 4.44 11/12/2020 1717   HGB 13.4 11/12/2020 1717   HGB 13.9 11/02/2020 1141   HCT 39.6 11/12/2020 1717   HCT 39.1 11/02/2020 1141   PLT 220 11/12/2020 1717   PLT 241 11/02/2020 1141   MCV 89.2 11/12/2020 1717   MCV 89 11/02/2020 1141   MCH 30.2 11/12/2020 1717   MCHC 33.8 11/12/2020 1717   RDW 12.3 11/12/2020 1717   RDW 12.1 11/02/2020 1141   LYMPHSABS 1.8 11/12/2020 1717   MONOABS 0.4 11/12/2020 1717  EOSABS 0.1 11/12/2020 1717   BASOSABS 0.0 11/12/2020 1717   No results for input(s): HGB in the last 8760 hours.  CMP     Component Value Date/Time   NA 135 11/12/2020 1717   NA 138 11/02/2020 1141   K 3.4 (L)  11/12/2020 1717   CL 104 11/12/2020 1717   CO2 20 (L) 11/12/2020 1717   GLUCOSE 149 (H) 11/12/2020 1717   BUN 10 11/12/2020 1717   BUN 10 11/02/2020 1141   CREATININE 0.59 11/12/2020 1717   CALCIUM 9.1 11/12/2020 1717   PROT 6.5 11/12/2020 1717   PROT 6.8 11/02/2020 1141   ALBUMIN 3.4 (L) 11/12/2020 1717   ALBUMIN 4.2 11/02/2020 1141   AST 16 11/12/2020 1717   ALT 15 11/12/2020 1717   ALKPHOS 63 11/12/2020 1717   BILITOT 0.6 11/12/2020 1717   BILITOT 0.3 11/02/2020 1141   GFRNONAA >60 11/12/2020 1717   GFRAA 144 11/02/2020 1141      Latest Ref Rng & Units 11/12/2020    5:17 PM 11/02/2020   11:41 AM 06/03/2020    6:45 PM  Hepatic Function  Total Protein 6.5 - 8.1 g/dL 6.5  6.8  7.2   Albumin 3.5 - 5.0 g/dL 3.4  4.2  4.2   AST 15 - 41 U/L 16  10  17    ALT 0 - 44 U/L 15  10  20    Alk Phosphatase 38 - 126 U/L 63  72  77   Total Bilirubin 0.3 - 1.2 mg/dL 0.6  0.3  0.8       Current Medications:   Current Outpatient Medications (Other):    B Complex Vitamins (B COMPLEX PO), Take by mouth as needed.   clonazePAM (KLONOPIN) 0.5 MG tablet, Take 0.5 mg by mouth daily as needed for anxiety.   dicyclomine  (BENTYL ) 20 MG tablet, Take 1 tablet (20 mg total) by mouth 3 (three) times daily as needed for spasms.   hyoscyamine  (LEVSIN ) 0.125 MG tablet, Take 1 tablet (0.125 mg total) by mouth every 6 (six) hours as needed for cramping.   L-Glutamine 500 MG CAPS, Take by mouth.   lubiprostone  (AMITIZA ) 8 MCG capsule, Take 1 capsule (8 mcg total) by mouth 2 (two) times daily with a meal.   MAGNESIUM GLYCINATE PO, Take by mouth as needed.   polyethylene glycol-electrolytes (NULYTELY) 420 g solution, do 6 oz every 30 mins until the impaction passes, can do 1/2 gallon on first day and 1/2 gallon on 2nd day.  Can continue 2 fleets enemas a day until it resolves.   Tenapanor HCl (IBSRELA ) 50 MG TABS, Take 50 mg by mouth 2 (two) times daily before a meal.   VITAMIN D PO, Take by mouth as  needed.  Medical History:  Past Medical History:  Diagnosis Date   ADHD    Anxiety    Bipolar 1 disorder (HCC)    Bladder spasms    BMI 27.0-27.9,adult    Chlamydia infection 06/2008   Chronic eczematous otitis externa of both ears 04/12/2023   Chronic migraine without aura without status migrainosus, not intractable 08/15/2021   Dysfunction of both eustachian tubes 04/12/2023   Gastritis    GERD (gastroesophageal reflux disease)    Goiter 04/11/2021   IBS (irritable bowel syndrome)    Intermenstrual spotting    Migraine    Palpitations 09/25/2023   Postural lightheadedness 09/25/2023   Pre-eclampsia 08/16/2014   Referred ear pain, bilateral 04/12/2023   Second degree AV block, Mobitz type  I, asymptomatic during sleeping hours on Zio patch 09/25/2023   Severe anxiety with panic    Stomach ulcer    Tinnitus of both ears 04/12/2023   UTI (urinary tract infection)    used to get bad UTIs   Allergies:  Allergies  Allergen Reactions   Molds & Smuts      Surgical History:  She  has a past surgical history that includes Wisdom tooth extraction. Family History:  Her family history includes Allergic rhinitis in her maternal grandmother; COPD in her paternal aunt and paternal grandmother; Irritable bowel syndrome in her father and mother; Migraines in her paternal aunt, paternal aunt, and paternal grandmother; Ulcers in her father.  REVIEW OF SYSTEMS  : All other systems reviewed and negative except where noted in the History of Present Illness.  PHYSICAL EXAM: BP 122/60   Pulse 78   Ht 5' 5.5 (1.664 m)   Wt 177 lb 6 oz (80.5 kg)   BMI 29.07 kg/m  Physical Exam   GENERAL APPEARANCE: Well nourished, in no apparent distress. HEENT: No cervical lymphadenopathy, unremarkable thyroid, sclerae anicteric, conjunctiva pink. RESPIRATORY: Respiratory effort normal, breath sounds equal bilaterally without rales, rhonchi, or wheezing. CARDIO: Regular rate and rhythm with no murmurs,  rubs, or gallops, peripheral pulses intact. ABDOMEN: Soft, non-distended, decreased bowel sounds, abdominal tenderness on palpation, rebound tenderness, no mass appreciated. RECTAL: Declines. MUSCULOSKELETAL: Full range of motion, normal gait, without edema. SKIN: Dry, intact without rashes or lesions. No jaundice. NEURO: Alert, oriented, no focal deficits. PSYCH: Cooperative, normal mood and affect.      Alan JONELLE Coombs, PA-C 10:43 AM   "

## 2024-12-16 NOTE — Progress Notes (Unsigned)
 SABRA

## 2024-12-17 ENCOUNTER — Ambulatory Visit: Payer: Self-pay | Admitting: Physician Assistant

## 2024-12-17 LAB — HELICOBACTER PYLORI  SPECIAL ANTIGEN
MICRO NUMBER:: 17431708
SPECIMEN QUALITY: ADEQUATE

## 2024-12-24 ENCOUNTER — Ambulatory Visit (HOSPITAL_COMMUNITY)
Admission: RE | Admit: 2024-12-24 | Discharge: 2024-12-24 | Disposition: A | Discharge status: Home / Self Care | Payer: MEDICAID | Source: Ambulatory Visit | Attending: Physician Assistant | Admitting: Physician Assistant

## 2024-12-24 DIAGNOSIS — K219 Gastro-esophageal reflux disease without esophagitis: Secondary | ICD-10-CM | POA: Diagnosis present

## 2024-12-24 DIAGNOSIS — R109 Unspecified abdominal pain: Secondary | ICD-10-CM | POA: Insufficient documentation

## 2025-01-03 ENCOUNTER — Encounter (HOSPITAL_BASED_OUTPATIENT_CLINIC_OR_DEPARTMENT_OTHER): Payer: Self-pay

## 2025-01-03 ENCOUNTER — Ambulatory Visit (HOSPITAL_BASED_OUTPATIENT_CLINIC_OR_DEPARTMENT_OTHER)
Admission: RE | Admit: 2025-01-03 | Discharge: 2025-01-03 | Disposition: A | Discharge status: Home / Self Care | Payer: MEDICAID | Source: Ambulatory Visit | Attending: Family Medicine | Admitting: Family Medicine

## 2025-01-03 VITALS — BP 100/51 | HR 59 | Temp 99.7°F | Resp 20

## 2025-01-03 DIAGNOSIS — R300 Dysuria: Secondary | ICD-10-CM | POA: Insufficient documentation

## 2025-01-03 DIAGNOSIS — R8290 Unspecified abnormal findings in urine: Secondary | ICD-10-CM | POA: Insufficient documentation

## 2025-01-03 DIAGNOSIS — N390 Urinary tract infection, site not specified: Secondary | ICD-10-CM | POA: Insufficient documentation

## 2025-01-03 LAB — POCT URINE DIPSTICK
Bilirubin, UA: NEGATIVE
Glucose, UA: 100 mg/dL — AB
Ketones, POC UA: NEGATIVE mg/dL
Nitrite, UA: POSITIVE — AB
Spec Grav, UA: 1.015
Urobilinogen, UA: 1 U/dL
pH, UA: 5.5

## 2025-01-03 MED ORDER — NITROFURANTOIN MONOHYD MACRO 100 MG PO CAPS: 100.0000 mg | ORAL_CAPSULE | Freq: Two times a day (BID) | ORAL | 0 refills | Status: AC

## 2025-01-03 MED ORDER — PHENAZOPYRIDINE HCL 200 MG PO TABS: 200.0000 mg | ORAL_TABLET | Freq: Three times a day (TID) | ORAL | 0 refills | Status: AC

## 2025-01-03 NOTE — ED Provider Notes (Signed)
 " [name]    CSN: [id number] Arrival date & time: 01/03/25  1323      History   Chief Complaint Chief Complaint  Patient presents with   Dysuria    HPI Laurie Guzman is a 34 y.o. female.   34 year old female with complaint of burning with urination since approximately 12/30/2024.  She is having right sided lower back pain and urinary frequency also.  She denies fever, nausea, vomiting, constipation, diarrhea and no history of kidney stones.  She has used OTC Azo for bladder pain.   Dysuria Associated symptoms: abdominal pain and flank pain   Associated symptoms: no fever, no nausea and no vomiting     Past Medical History:  Diagnosis Date   ADHD    Anxiety    Bipolar 1 disorder (HCC)    Bladder spasms    BMI 27.0-27.9,adult    Chlamydia infection 06/2008   Chronic eczematous otitis externa of both ears 04/12/2023   Chronic migraine without aura without status migrainosus, not intractable 08/15/2021   Dysfunction of both eustachian tubes 04/12/2023   Gastritis    GERD (gastroesophageal reflux disease)    Goiter 04/11/2021   IBS (irritable bowel syndrome)    Intermenstrual spotting    Migraine    Palpitations 09/25/2023   Postural lightheadedness 09/25/2023   Pre-eclampsia 08/16/2014   Referred ear pain, bilateral 04/12/2023   Second degree AV block, Mobitz type I, asymptomatic during sleeping hours on Zio patch 09/25/2023   Severe anxiety with panic    Stomach ulcer    Tinnitus of both ears 04/12/2023   UTI (urinary tract infection)    used to get bad UTIs    Patient Active Problem List   Diagnosis Date Noted   Palpitations 09/25/2023   Second degree AV block, Mobitz type I, asymptomatic during sleeping hours on Zio patch 09/25/2023   Postural lightheadedness 09/25/2023   Bladder spasms    BMI 27.0-27.9,adult    Gastritis    GERD (gastroesophageal reflux disease)    Intermenstrual spotting    Migraine    Severe anxiety with panic     Stomach ulcer    UTI (urinary tract infection)    Chronic eczematous otitis externa of both ears 04/12/2023   Dysfunction of both eustachian tubes 04/12/2023   Referred ear pain, bilateral 04/12/2023   Tinnitus of both ears 04/12/2023   Chronic migraine without aura without status migrainosus, not intractable 08/15/2021   Goiter 04/11/2021   IBS (irritable bowel syndrome)    Bipolar 1 disorder (HCC)    Anxiety    ADHD    Pre-eclampsia 08/16/2014   Chlamydia infection 06/2008    Past Surgical History:  Procedure Laterality Date   WISDOM TOOTH EXTRACTION      OB History     Gravida  1   Para      Term      Preterm      AB      Living         SAB      IAB      Ectopic      Multiple      Live Births               Home Medications    Prior to Admission medications  Medication Sig Start Date End Date Taking? Authorizing Provider  nitrofurantoin , macrocrystal-monohydrate, (MACROBID ) 100 MG capsule Take 1 capsule (100 mg total) by mouth 2 (two) times daily for 7 days. 01/03/25  01/10/25 Yes Ival Domino, FNP  phenazopyridine  (PYRIDIUM ) 200 MG tablet Take 1 tablet (200 mg total) by mouth 3 (three) times daily. 01/03/25  Yes Ival Domino, FNP  B Complex Vitamins (B COMPLEX PO) Take by mouth as needed.    [provider]  clonazePAM (KLONOPIN) 0.5 MG tablet Take 0.5 mg by mouth daily as needed for anxiety. 09/03/23   [provider]  dicyclomine  (BENTYL ) 20 MG tablet Take 1 tablet (20 mg total) by mouth 3 (three) times daily as needed for spasms. 11/26/24   Craig Alan SAUNDERS, PA-C  hyoscyamine  (LEVSIN ) 0.125 MG tablet Take 1 tablet (0.125 mg total) by mouth every 6 (six) hours as needed for cramping. 10/29/24   Craig Alan SAUNDERS, PA-C  L-Glutamine 500 MG CAPS Take by mouth.    [provider]  lubiprostone  (AMITIZA ) 8 MCG capsule Take 1 capsule (8 mcg total) by mouth 2 (two) times daily with a meal. 11/26/24   Craig Alan SAUNDERS, PA-C   MAGNESIUM GLYCINATE PO Take by mouth as needed.    [provider]  polyethylene glycol-electrolytes (NULYTELY) 420 g solution do 6 oz every 30 mins until the impaction passes, can do 1/2 gallon on first day and 1/2 gallon on 2nd day.  Can continue 2 fleets enemas a day until it resolves. 12/16/24   Craig Alan SAUNDERS, PA-C  Tenapanor HCl (IBSRELA ) 50 MG TABS Take 50 mg by mouth 2 (two) times daily before a meal. 12/16/24   Craig Alan SAUNDERS, PA-C  VITAMIN D PO Take by mouth as needed.    [provider]    Family History Family History  Problem Relation Age of Onset   Irritable bowel syndrome Mother    Irritable bowel syndrome Father    Ulcers Father        Stomach Ulcers   Migraines Paternal Aunt    Migraines Paternal Aunt    COPD Paternal Aunt    Allergic rhinitis Maternal Grandmother    COPD Paternal Grandmother    Migraines Paternal Grandmother     Social History Social History[1]   Allergies   Molds & smuts   Review of Systems Review of Systems  Constitutional:  Negative for chills and fever.  HENT:  Negative for ear pain and sore throat.   Eyes:  Negative for pain and visual disturbance.  Respiratory:  Negative for cough and shortness of breath.   Cardiovascular:  Negative for chest pain and palpitations.  Gastrointestinal:  Positive for abdominal pain. Negative for constipation, diarrhea, nausea and vomiting.  Genitourinary:  Positive for dysuria, flank pain and frequency. Negative for hematuria.  Musculoskeletal:  Negative for arthralgias and back pain.  Skin:  Negative for color change and rash.  Neurological:  Negative for seizures and syncope.  All other systems reviewed and are negative.    Physical Exam Triage Vital Signs ED Triage Vitals  Encounter Vitals Group     BP 01/03/25 1422 (!) 100/51     Girls Systolic BP Percentile --      Girls Diastolic BP Percentile --      Boys Systolic BP Percentile --      Boys Diastolic BP Percentile  --      Pulse Rate 01/03/25 1422 (!) 59     Resp 01/03/25 1422 20     Temp 01/03/25 1422 99.7 F (37.6 C)     Temp Source 01/03/25 1422 Oral     SpO2 01/03/25 1422 98 %     Weight --  Height --      Head Circumference --      Peak Flow --      Pain Score 01/03/25 1420 8     Pain Loc --      Pain Education --      Exclude from Growth Chart --    No data found.  Updated Vital Signs BP (!) 100/51 (BP Location: Right Arm)   Pulse (!) 59   Temp 99.7 F (37.6 C) (Oral)   Resp 20   LMP 12/19/2024   SpO2 98%   Visual Acuity Right Eye Distance:   Left Eye Distance:   Bilateral Distance:    Right Eye Near:   Left Eye Near:    Bilateral Near:     Physical Exam Vitals and nursing note reviewed.  Constitutional:      General: She is not in acute distress.    Appearance: She is well-developed. She is not ill-appearing, toxic-appearing or diaphoretic.  HENT:     Head: Normocephalic and atraumatic.     Right Ear: Hearing, tympanic membrane, ear canal and external ear normal.     Left Ear: Hearing, tympanic membrane, ear canal and external ear normal.     Nose: No congestion or rhinorrhea.     Right Sinus: No maxillary sinus tenderness or frontal sinus tenderness.     Left Sinus: No maxillary sinus tenderness or frontal sinus tenderness.     Mouth/Throat:     Lips: Pink.     Mouth: Mucous membranes are moist.     Pharynx: Uvula midline. No oropharyngeal exudate or posterior oropharyngeal erythema.     Tonsils: No tonsillar exudate.  Eyes:     Conjunctiva/sclera: Conjunctivae normal.     Pupils: Pupils are equal, round, and reactive to light.  Cardiovascular:     Rate and Rhythm: Normal rate and regular rhythm.     Heart sounds: S1 normal and S2 normal. No murmur heard. Pulmonary:     Effort: Pulmonary effort is normal. No respiratory distress.     Breath sounds: Normal breath sounds. No decreased breath sounds, wheezing, rhonchi or rales.  Abdominal:     General:  Bowel sounds are normal.     Palpations: Abdomen is soft.     Tenderness: There is abdominal tenderness in the suprapubic area and left lower quadrant. There is no right CVA tenderness, left CVA tenderness, guarding or rebound. Negative signs include Murphy's sign, Rovsing's sign and McBurney's sign.  Musculoskeletal:        General: No swelling.     Cervical back: Neck supple.  Lymphadenopathy:     Head:     Right side of head: No submental, submandibular, tonsillar, preauricular or posterior auricular adenopathy.     Left side of head: No submental, submandibular, tonsillar, preauricular or posterior auricular adenopathy.     Cervical: No cervical adenopathy.     Right cervical: No superficial cervical adenopathy.    Left cervical: No superficial cervical adenopathy.  Skin:    General: Skin is warm.     Capillary Refill: Capillary refill takes less than 2 seconds.     Findings: No rash.  Neurological:     Mental Status: She is alert and oriented to person, place, and time.  Psychiatric:        Mood and Affect: Mood normal.      UC Treatments / Results  Labs (all labs ordered are listed, but only abnormal results are displayed) Labs Reviewed  POCT URINE  DIPSTICK - Abnormal; Notable for the following components:      Result Value   Color, UA orange (*)    Clarity, UA cloudy (*)    Glucose, UA =100 (*)    Blood, UA trace-intact (*)    Nitrite, UA Positive (*)    Leukocytes, UA Large (3+) (*)    All other components within normal limits  URINE CULTURE    EKG   Radiology No results found.  Procedures Procedures (including critical care time)  Medications Ordered in UC Medications - No data to display  Initial Impression / Assessment and Plan / UC Course  I have reviewed the triage vital signs and the nursing notes.  Pertinent labs & imaging results that were available during my care of the patient were reviewed by me and considered in my medical decision making  (see chart for details).   Plan of Care (see discharge instructions for additional patient precautions and education): UTI with abnormal urine and dysuria: Urinalysis appeared positive for an acute UTI.  Urine culture sent.  Will adjust the plan of care, if needed once the culture results.  Nitrofurantoin , 100 mg, twice daily for 7 days.  Pyridium  200 mg every 8 hours if needed for bladder pain.  Get plenty of fluids and rest.  Follow-up if symptoms do not improve, worsen or new symptoms occur.  I reviewed the plan of care with the patient and/or the patient's guardian.  The patient and/or guardian had time to ask questions and acknowledged that the questions were answered.  Final Clinical Impressions(s) / UC Diagnoses   Final diagnoses:  Dysuria  Abnormal urinalysis  Urinary tract infection without hematuria, site unspecified     Discharge Instructions      UTI with abnormal urine and dysuria: Urinalysis appeared positive for an acute UTI.  Urine culture sent.  Will adjust the plan of care, if needed once the culture results.  Nitrofurantoin , 100 mg, twice daily for 7 days.  Pyridium  200 mg every 8 hours if needed for bladder pain.  Get plenty of fluids and rest.  Follow-up if symptoms do not improve, worsen or new symptoms occur.     ED Prescriptions     Medication Sig Dispense Auth. Provider   nitrofurantoin , macrocrystal-monohydrate, (MACROBID ) 100 MG capsule Take 1 capsule (100 mg total) by mouth 2 (two) times daily for 7 days. 14 capsule Ival Domino, FNP   phenazopyridine  (PYRIDIUM ) 200 MG tablet Take 1 tablet (200 mg total) by mouth 3 (three) times daily. 6 tablet Christophr Calix, FNP      PDMP not reviewed this encounter.    [1]  Social History Tobacco Use   Smoking status: Some Days    Types: Cigarettes   Smokeless tobacco: Never   Tobacco comments:    Social smoker; last smoked in August 2021  Vaping Use   Vaping status: Never Used  Substance Use Topics    Alcohol use: Yes   Drug use: Yes    Types: Marijuana     Ival Domino, FNP 01/03/25 1501  "

## 2025-01-03 NOTE — Discharge Instructions (Addendum)
 UTI with abnormal urine and dysuria: Urinalysis appeared positive for an acute UTI.  Urine culture sent.  Will adjust the plan of care, if needed once the culture results.  Nitrofurantoin , 100 mg, twice daily for 7 days.  Pyridium  200 mg every 8 hours if needed for bladder pain.  Get plenty of fluids and rest.  Follow-up if symptoms do not improve, worsen or new symptoms occur.

## 2025-01-03 NOTE — ED Triage Notes (Signed)
 Pain/burning with urination x 3 days. Frequency. Right side low back pain. No hx of kidney stones. Took AZO today.

## 2025-01-05 ENCOUNTER — Ambulatory Visit (HOSPITAL_BASED_OUTPATIENT_CLINIC_OR_DEPARTMENT_OTHER): Payer: Self-pay | Admitting: Family Medicine

## 2025-01-05 LAB — URINE CULTURE: Culture: >=100000 — AB

## 2025-01-05 NOTE — Progress Notes (Signed)
 Urine culture is abnormal.  It shows an acute urinary tract infection.  It is sensitive to nitrofurantoin .  Patient is encouraged to complete the nitrofurantoin  and follow-up if needed.

## 2025-01-08 NOTE — Telephone Encounter (Signed)
 Duplicate message from today. Refer to 12/17/24 mychart.

## 2025-01-16 ENCOUNTER — Other Ambulatory Visit (HOSPITAL_COMMUNITY): Payer: Self-pay

## 2025-01-16 ENCOUNTER — Telehealth: Payer: Self-pay

## 2025-01-16 NOTE — Telephone Encounter (Signed)
 Pharmacy Patient Advocate Encounter   Received notification from [facility] that prior authorization for Ibsrela  50MG  tablets is required/requested.   Insurance verification completed.   The patient is insured through [facility] MEDICAID.   Per test claim: PA required; PA submitted to above mentioned insurance via Latent Key/confirmation #/[id number] [facility] Status is pending
